# Patient Record
Sex: Male | Born: 1990 | Race: White | Hispanic: No | Marital: Single | Smoking: Current every day smoker
Health system: Southern US, Community
[De-identification: ages and names within clinical notes are randomized; demographics above are authoritative.]

## PROBLEM LIST (undated history)

## (undated) DIAGNOSIS — E7800 Pure hypercholesterolemia, unspecified: Secondary | ICD-10-CM

## (undated) DIAGNOSIS — M1990 Unspecified osteoarthritis, unspecified site: Secondary | ICD-10-CM

## (undated) DIAGNOSIS — F3181 Bipolar II disorder: Secondary | ICD-10-CM

## (undated) DIAGNOSIS — F79 Unspecified intellectual disabilities: Secondary | ICD-10-CM

## (undated) HISTORY — PX: NOSE SURGERY: SHX723

---

## 2010-03-07 ENCOUNTER — Encounter: Payer: Self-pay | Admitting: *Deleted

## 2010-03-13 ENCOUNTER — Emergency Department (HOSPITAL_COMMUNITY): Admission: EM | Admit: 2010-03-13 | Discharge: 2010-03-13 | Payer: Self-pay | Admitting: Emergency Medicine

## 2010-08-19 ENCOUNTER — Emergency Department (HOSPITAL_COMMUNITY): Admission: EM | Admit: 2010-08-19 | Discharge: 2010-08-19 | Payer: Self-pay | Admitting: Emergency Medicine

## 2010-09-01 ENCOUNTER — Emergency Department (HOSPITAL_COMMUNITY): Admission: EM | Admit: 2010-09-01 | Discharge: 2010-09-01 | Payer: Self-pay | Admitting: Emergency Medicine

## 2010-10-25 ENCOUNTER — Emergency Department (HOSPITAL_COMMUNITY): Admission: EM | Admit: 2010-10-25 | Discharge: 2010-10-26 | Payer: Self-pay | Admitting: Emergency Medicine

## 2010-11-13 ENCOUNTER — Inpatient Hospital Stay (HOSPITAL_COMMUNITY)
Admission: RE | Admit: 2010-11-13 | Discharge: 2010-11-17 | Payer: Self-pay | Source: Home / Self Care | Attending: Psychiatry | Admitting: Psychiatry

## 2010-11-13 ENCOUNTER — Emergency Department (HOSPITAL_COMMUNITY)
Admission: EM | Admit: 2010-11-13 | Discharge: 2010-11-13 | Disposition: A | Discharge status: Other Acute Inpatient Hospital | Payer: Self-pay | Source: Home / Self Care | Admitting: Emergency Medicine

## 2011-01-06 NOTE — Miscellaneous (Signed)
Summary: Do Not Reschedule!  Missed NP appt.  Per [facility] policy is not allowed to reschedule.  Dennison Nancy RN  March 07, 2010 11:29 AM

## 2011-02-17 LAB — CBC
HCT: 43.1 % (ref 39.0–52.0)
Hemoglobin: 14.9 g/dL (ref 13.0–17.0)
MCH: 29.2 pg (ref 26.0–34.0)
MCHC: 35.1 g/dL (ref 30.0–36.0)
MCV: 83.1 fL (ref 78.0–100.0)
Platelets: 156 10*3/uL (ref 150–400)
RBC: 5.04 MIL/uL (ref 4.22–5.81)
RBC: 5.1 MIL/uL (ref 4.22–5.81)

## 2011-02-17 LAB — BASIC METABOLIC PANEL
BUN: 9 mg/dL (ref 6–23)
CO2: 26 mEq/L (ref 19–32)
CO2: 26 mEq/L (ref 19–32)
Calcium: 9.5 mg/dL (ref 8.4–10.5)
Calcium: 9.7 mg/dL (ref 8.4–10.5)
Chloride: 102 mEq/L (ref 96–112)
Creatinine, Ser: 0.66 mg/dL (ref 0.4–1.5)
GFR calc Af Amer: >60 mL/min (ref >60)
GFR calc Af Amer: >60 mL/min (ref >60)
GFR calc non Af Amer: >60 mL/min (ref >60)
Glucose, Bld: 90 mg/dL (ref 70–99)
Potassium: 3.8 mEq/L (ref 3.5–5.1)
Sodium: 141 mEq/L (ref 135–145)

## 2011-02-17 LAB — RAPID URINE DRUG SCREEN, HOSP PERFORMED
Amphetamines: NOT DETECTED
Amphetamines: NOT DETECTED
Barbiturates: NOT DETECTED
Benzodiazepines: NOT DETECTED
Cocaine: NOT DETECTED
Opiates: NOT DETECTED
Tetrahydrocannabinol: NOT DETECTED
Tetrahydrocannabinol: NOT DETECTED

## 2011-02-17 LAB — DIFFERENTIAL
Basophils Relative: 1 % (ref 0–1)
Eosinophils Absolute: 0.1 10*3/uL (ref 0.0–0.7)
Eosinophils Relative: 2 % (ref 0–5)
Lymphs Abs: 3 10*3/uL (ref 0.7–4.0)
Lymphs Abs: 3.3 10*3/uL (ref 0.7–4.0)
Monocytes Relative: 8 % (ref 3–12)
Neutro Abs: 4.3 10*3/uL (ref 1.7–7.7)
Neutrophils Relative %: 50 % (ref 43–77)
Neutrophils Relative %: 54 % (ref 43–77)

## 2011-05-01 ENCOUNTER — Inpatient Hospital Stay (INDEPENDENT_AMBULATORY_CARE_PROVIDER_SITE_OTHER)
Admission: RE | Admit: 2011-05-01 | Discharge: 2011-05-01 | Disposition: A | Discharge status: Home / Self Care | Payer: Self-pay | Source: Ambulatory Visit | Attending: Family Medicine | Admitting: Family Medicine

## 2011-05-01 DIAGNOSIS — L989 Disorder of the skin and subcutaneous tissue, unspecified: Secondary | ICD-10-CM

## 2011-05-01 DIAGNOSIS — L259 Unspecified contact dermatitis, unspecified cause: Secondary | ICD-10-CM

## 2016-01-16 ENCOUNTER — Emergency Department (HOSPITAL_COMMUNITY)
Admission: EM | Admit: 2016-01-16 | Discharge: 2016-01-16 | Disposition: A | Discharge status: Home / Self Care | Payer: Medicaid Other | Attending: Emergency Medicine | Admitting: Emergency Medicine

## 2016-01-16 ENCOUNTER — Emergency Department (HOSPITAL_COMMUNITY): Payer: Medicaid Other

## 2016-01-16 ENCOUNTER — Encounter (HOSPITAL_COMMUNITY): Payer: Self-pay

## 2016-01-16 DIAGNOSIS — Z79899 Other long term (current) drug therapy: Secondary | ICD-10-CM | POA: Diagnosis not present

## 2016-01-16 DIAGNOSIS — R202 Paresthesia of skin: Secondary | ICD-10-CM | POA: Diagnosis not present

## 2016-01-16 DIAGNOSIS — Z8659 Personal history of other mental and behavioral disorders: Secondary | ICD-10-CM | POA: Insufficient documentation

## 2016-01-16 DIAGNOSIS — Z8639 Personal history of other endocrine, nutritional and metabolic disease: Secondary | ICD-10-CM | POA: Insufficient documentation

## 2016-01-16 DIAGNOSIS — R0789 Other chest pain: Secondary | ICD-10-CM | POA: Diagnosis not present

## 2016-01-16 DIAGNOSIS — F17210 Nicotine dependence, cigarettes, uncomplicated: Secondary | ICD-10-CM | POA: Insufficient documentation

## 2016-01-16 DIAGNOSIS — R079 Chest pain, unspecified: Secondary | ICD-10-CM | POA: Diagnosis present

## 2016-01-16 HISTORY — DX: Bipolar II disorder: F31.81

## 2016-01-16 HISTORY — DX: Unspecified intellectual disabilities: F79

## 2016-01-16 HISTORY — DX: Pure hypercholesterolemia, unspecified: E78.00

## 2016-01-16 LAB — BASIC METABOLIC PANEL
Anion gap: 11 (ref 5–15)
BUN: 12 mg/dL (ref 6–20)
CALCIUM: 9.2 mg/dL (ref 8.9–10.3)
CO2: 23 mmol/L (ref 22–32)
CREATININE: 0.57 mg/dL — AB (ref 0.61–1.24)
Chloride: 104 mmol/L (ref 101–111)
GFR calc non Af Amer: >60 mL/min (ref >60)
GLUCOSE: 113 mg/dL — AB (ref 65–99)
Potassium: 3.7 mmol/L (ref 3.5–5.1)
Sodium: 138 mmol/L (ref 135–145)

## 2016-01-16 LAB — CBC
HCT: 41.2 % (ref 39.0–52.0)
Hemoglobin: 13.6 g/dL (ref 13.0–17.0)
MCH: 28.3 pg (ref 26.0–34.0)
MCHC: 33 g/dL (ref 30.0–36.0)
MCV: 85.7 fL (ref 78.0–100.0)
PLATELETS: 145 10*3/uL — AB (ref 150–400)
RBC: 4.81 MIL/uL (ref 4.22–5.81)
RDW: 13.3 % (ref 11.5–15.5)
WBC: 10.7 10*3/uL — ABNORMAL HIGH (ref 4.0–10.5)

## 2016-01-16 LAB — I-STAT TROPONIN, ED: TROPONIN I, POC: 0 ng/mL (ref 0.00–0.08)

## 2016-01-16 NOTE — ED Provider Notes (Signed)
CSN: [id number]     Arrival date & time 01/16/16  1515 History   First MD Initiated Contact with Patient 01/16/16 1543     Chief Complaint  Patient presents with  . Chest Pain  . Numbness    HPI   Glenn French is a 25 y.o. male with a PMH of HLD, MR, bipolar disorder who presents to the ED with right sided chest pain, which he states has been present for the past month. He reports his symptoms are intermittent. He denies exacerbating factors. He has tried ibuprofen for symptom relief, which has been minimally effective. He denies fever, chills, cough, congestion, shortness of breath, abdominal pain, N/V, recent travel or immobility, recent surgery, history of DVT/PE, history of malignancy, lower extremity edema. He also notes right arm paresthesia, which he states started today. He denies numbness or weakness.   Past Medical History  Diagnosis Date  . High cholesterol   . Mental retardation   . Bipolar 2 disorder [facility])    Past Surgical History  Procedure Laterality Date  . Nose surgery     History reviewed. No pertinent family history. Social History  Substance Use Topics  . Smoking status: Current Every Day Smoker -- 0.50 packs/day    Types: Cigarettes  . Smokeless tobacco: None  . Alcohol Use: No      Review of Systems  Constitutional: Negative for fever and chills.  HENT: Negative for congestion.   Respiratory: Negative for cough and shortness of breath.   Cardiovascular: Positive for chest pain. Negative for palpitations.  Gastrointestinal: Negative for nausea, vomiting and abdominal pain.  Neurological: Negative for weakness and numbness.  All other systems reviewed and are negative.     Allergies  Review of patient's allergies indicates no known allergies.  Home Medications   Prior to Admission medications   Medication Sig Start Date End Date Taking? Authorizing Provider  divalproex (DEPAKOTE ER) 250 MG 24 hr tablet Take 500 mg by mouth at bedtime.   Yes  Historical Provider, MD  ibuprofen (ADVIL,MOTRIN) 200 MG tablet Take 600 mg by mouth daily as needed for headache or moderate pain.   Yes Historical Provider, MD  Paliperidone Palmitate (INVEGA SUSTENNA) 117 MG/0.75ML SUSP Inject 117 mg into the muscle every 30 (thirty) days.   Yes Historical Provider, MD  polyethylene glycol (MIRALAX / GLYCOLAX) packet Take 17 g by mouth daily.   Yes Historical Provider, MD  psyllium (METAMUCIL) 58.6 % powder Take 1 packet by mouth 3 (three) times daily.   Yes Historical Provider, MD    BP 148/96 mmHg  Pulse 74  Temp(Src) 98 F (36.7 C) (Oral)  Resp 17  SpO2 96% Physical Exam  Constitutional: He is oriented to person, place, and time. He appears well-developed and well-nourished. No distress.  HENT:  Head: Normocephalic and atraumatic.  Right Ear: External ear normal.  Left Ear: External ear normal.  Nose: Nose normal.  Mouth/Throat: Uvula is midline, oropharynx is clear and moist and mucous membranes are normal.  Eyes: Conjunctivae, EOM and lids are normal. Pupils are equal, round, and reactive to light. Right eye exhibits no discharge. Left eye exhibits no discharge. No scleral icterus.  Neck: Normal range of motion. Neck supple.  Cardiovascular: Normal rate, regular rhythm, normal heart sounds, intact distal pulses and normal pulses.   Pulmonary/Chest: Effort normal and breath sounds normal. No respiratory distress. He has no wheezes. He has no rales. He exhibits tenderness.  Right anterior chest wall TTP. Patient states this  reproduces his chest pain.  Abdominal: Soft. Normal appearance and bowel sounds are normal. He exhibits no distension and no mass. There is no tenderness. There is no rigidity, no rebound and no guarding.  Musculoskeletal: Normal range of motion. He exhibits no edema or tenderness.  Neurological: He is alert and oriented to person, place, and time. He has normal strength. No sensory deficit.  Skin: Skin is warm, dry and intact.  No rash noted. He is not diaphoretic. No erythema. No pallor.  Psychiatric: He has a normal mood and affect. His speech is normal and behavior is normal.  Nursing note and vitals reviewed.   ED Course  Procedures (including critical care time)  Labs Review Labs Reviewed  BASIC METABOLIC PANEL - Abnormal; Notable for the following:    Glucose, Bld 113 (*)    Creatinine, Ser 0.57 (*)    All other components within normal limits  CBC - Abnormal; Notable for the following:    WBC 10.7 (*)    Platelets 145 (*)    All other components within normal limits  I-STAT TROPOININ, ED    Imaging Review Dg Chest 2 View  01/16/2016  CLINICAL DATA:  Right-sided chest pain for 1 month EXAM: CHEST  2 VIEW COMPARISON:  None. FINDINGS: Lungs are clear. Heart size and pulmonary vascularity are normal. No adenopathy. No pneumothorax. No bone lesions. IMPRESSION: No edema or consolidation. Electronically Signed   By: Bretta Bang III M.D.   On: 01/16/2016 16:21     I have personally reviewed and evaluated these images and lab results as part of my medical decision-making.   EKG Interpretation   Date/Time:  Thursday January 16 2016 16:28:10 EST Ventricular Rate:  79 PR Interval:  158 QRS Duration: 86 QT Interval:  389 QTC Calculation: 446 R Axis:   80 Text Interpretation:  Sinus rhythm No old tracing to compare Confirmed by  KNAPP  MD-J, JON ([id number]) on 01/16/2016 4:33:35 PM      MDM   Final diagnoses:  Chest pain, unspecified chest pain type    25 year old male presents with chest pain. Denies cough, shortness of breath. Patient is afebrile. Vital signs stable. Heart RRR. Lungs clear to auscultation bilaterally. Right anterior chest wall TTP, patient states this reproduces his pain. EKG sinus rhythm, heart rate 79. Troponin negative. Chest x-ray no edema or consolidation. CBC remarkable for leukocytosis of 10.7, no anemia. BMP unremarkable. Doubt ACS given duration of time since symptom  onset and unremarkable workup in the ED. Patient is PERC negative, low suspicion for PE. Patient is non-toxic and well-appearing, feel he is stable for discharge at this time. Symptoms likely muscular, as pain is reproducible on exam of his chest wall. Return precautions discussed. Patient to follow-up with PCP for further evaluation and management. Patient verbalizes his understanding and is in agreement with plan.  BP 148/96 mmHg  Pulse 74  Temp(Src) 98 F (36.7 C) (Oral)  Resp 17  SpO2 96%     Mady Gemma, PA-C 01/17/16 0107  Linwood Dibbles, MD 01/18/16 1226

## 2016-01-16 NOTE — ED Notes (Signed)
Pt c/o intermittent central chest pressure and R axilla pain x "over a month" and R arm numbness starting around noon.  Pain score 5/10.  Denies injury.  Grips/pushes/pulls equal.  Hx of smoking and high cholesterol.

## 2016-01-16 NOTE — Discharge Instructions (Signed)
1. Medications: ibuprofen for pain, usual home medications 2. Treatment: rest, drink plenty of fluids 3. Follow Up: please followup with your primary doctor for discussion of your diagnoses and further evaluation after today's visit; if you do not have a primary care doctor use the resource guide provided to find one; please return to the ER for increased pain, shortness of breath, new or worsening symptoms   Chest Wall Pain Chest wall pain is pain in or around the bones and muscles of your chest. Sometimes, an injury causes this pain. Sometimes, the cause may not be known. This pain may take several weeks or longer to get better. HOME CARE Pay attention to any changes in your symptoms. Take these actions to help with your pain:  Rest as told by your doctor.  Avoid activities that cause pain. Try not to use your chest, belly (abdominal), or side muscles to lift heavy things.  If directed, apply ice to the painful area:  Put ice in a plastic bag.  Place a towel between your skin and the bag.  Leave the ice on for 20 minutes, 2-3 times per day.  Take over-the-counter and prescription medicines only as told by your doctor.  Do not use tobacco products, including cigarettes, chewing tobacco, and e-cigarettes. If you need help quitting, ask your doctor.  Keep all follow-up visits as told by your doctor. This is important. GET HELP IF:  You have a fever.  Your chest pain gets worse.  You have new symptoms. GET HELP RIGHT AWAY IF:  You feel sick to your stomach (nauseous) or you throw up (vomit).  You feel sweaty or light-headed.  You have a cough with phlegm (sputum) or you cough up blood.  You are short of breath.   This information is not intended to replace advice given to you by your health care provider. Make sure you discuss any questions you have with your health care provider.   Document Released: 05/11/2008 Document Revised: 08/14/2015 Document Reviewed:  02/18/2015 Elsevier Interactive Patient Education 2016 [employer].   Emergency Department Resource Guide 1) Find a Doctor and Pay Out of Pocket Although you won't have to find out who is covered by your insurance plan, it is a good idea to ask around and get recommendations. You will then need to call the office and see if the doctor you have chosen will accept you as a new patient and what types of options they offer for patients who are self-pay. Some doctors offer discounts or will set up payment plans for their patients who do not have insurance, but you will need to ask so you aren't surprised when you get to your appointment.  2) Contact Your Local Health Department Not all health departments have doctors that can see patients for sick visits, but many do, so it is worth a call to see if yours does. If you don't know where your local health department is, you can check in your phone book. The CDC also has a tool to help you locate your state's health department, and many state websites also have listings of all of their local health departments.  3) Find a Walk-in Clinic If your illness is not likely to be very severe or complicated, you may want to try a walk in clinic. These are popping up all over the country in pharmacies, drugstores, and shopping centers. They're usually staffed by nurse practitioners or physician assistants that have been trained to treat common illnesses and complaints.  They're usually fairly quick and inexpensive. However, if you have serious medical issues or chronic medical problems, these are probably not your best option.  No Primary Care Doctor: - Call Health Connect at  [phone] - they can help you locate a primary care doctor that  accepts your insurance, provides certain services, etc. - Physician Referral Service- [phone]  Chronic Pain Problems: Organization         Address  Phone   Notes  [facility]  [phone]) [phone] Patients  need to be referred by their primary care doctor.   Medication Assistance: Organization         Address  Phone   Notes  [facility]ication [facility] [address] [place], [place] [address] [phone]) [phone] --Must be a resident of [facility] [place] -- Must have NO insurance coverage whatsoever (no Medicaid/ Medicare, etc.) -- The pt. MUST have a primary care doctor that directs their care regularly and follows them in the community   MedAssist  [phone]) [phone]   [employer]  [phone]    Agencies that provide inexpensive medical care: Organization         Address  Phone   Notes  [facility]  [phone]) [phone]   [facility]    [phone]) [phone]   [facility] [address] [place], [place] [address] [phone]) [phone]   [facility] [address], [place] [phone]   Planned Parenthood    [phone]) [phone]   [facility]    [phone](336) [phone]   [facility]  [address], [place] Phone:  [phone], Fax:  [phone]) [phone] Hours of Operation:  9 am - 6 pm, M-F.  Also accepts Medicaid/Medicare and self-pay.  [facility]  [address], [place] Phone: [phone]) [phone], Fax: [phone]) [phone]. Hours of Operation:  8:30 am - 5:30 pm, M-F.  Also accepts Medicaid and self-pay.  [facility] [place] [address], [place] Phone: [phone]) [phone]   [facility] [address], [place], [place] [phone], Ext. [phone] Mondays & Thursdays: 7-9 AM.  First 15 patients are seen on a first come, first serve basis.    Medicaid-accepting [facility] Providers:  Organization         Address  Phone   Notes  [facility] [address], [place] [phone]) [phone] Also accepts self-pay patients.  [facility] [address], [place]  [phone](336) [phone]    [facility] [address], [place] [phone]) [phone]   [facility] [address] [place] [address], [place] [phone]) [phone]   Renaye RakersVeita Bland [address], [place]   [phone]) [phone] Only accepts [place] Access [place] patients after they have their name applied to their card.   Self-Pay (no insurance) in [facility]:  Organization         Address  Phone   Notes  Sickle Cell Patients, [facility] [address], [place] [phone]) [phone]   [facility] [address], [place] [phone]   [facility] [place]  [address], [place] [phone]) [phone]   [facility]/Dr. Osei-Bonsu  [address] [place] [address], [place] or [phone], [place] [phone] Phone number for both [facility] and [place] locations is the same.  [facility] [address], [place] [phone]   [facility] [address], [place] or [address] [phone]   [facility] [address] [place] [phone], phone; [phone], fax Sees patients 1st and 3rd Saturday of every month.  Must not qualify for public or private insurance (i.e. Medicaid, Medicare, [place] Health Choice, Veterans' Benefits)  Household income should be no more than 200% of the poverty level The clinic cannot treat you if you are pregnant or think you are pregnant  Sexually transmitted diseases are not treated at the clinic.    Dental Care: Organization         Address  Phone  Notes  [facility] [address] [place], [place] [phone] Accepts children up to age 79 who are enrolled in [place] or [place] Health Choice; pregnant women with a Medicaid card; and children who have applied for Medicaid or [place] Health Choice, but were declined,  whose parents can pay a reduced fee at time of service.  [facility] of [facility]  [address], [place] [phone] Accepts children up to age 7 who are enrolled in [place] or [place] Health Choice; pregnant women with a Medicaid card; and children who have applied for Medicaid or [place] Health Choice, but were declined, whose parents can pay a reduced fee at time of service.  [facility]  [address] [place], [place] [phone] Patients are seen by appointment only. Walk-ins are not accepted. [facility] will see patients 17 years of age and older. Monday - Tuesday (8am-5pm) Most Wednesdays (8:30-5pm) $30 per visit, cash only  [facility] Access PRO[facility]  [address], [facility] [phone] Patients are seen by appointment only. Walk-ins are not accepted. [facility] will see patients 37 years of age and older. One Wednesday Evening (Monthly: Volunteer Based).  $30 per visit, cash only  [employer]  [phone] for adults; Children under age 86, call Graduate Pediatric Dentistry at [phone]. Children aged 11-14, please call [phone] to request a pediatric application.  Dental services are provided in all areas of dental care including fillings, crowns and bridges, complete and partial dentures, implants, gum treatment, root canals, and extractions. Preventive care is also provided. Treatment is provided to both adults and children. Patients are selected via a lottery and there is often a waiting list.   [facility] [address] [place]  [phone] [email or web address]   [facility] [address] [place], [place] [phone], Ext. [phone] Second and Fourth Thursday of each month, opens at 6:30 AM; Clinic ends at 9 AM.  Patients are seen on a first-come first-served basis, and a limited number are seen during each clinic.   [facility]  [address] [place], [place] [phone]   Eligibility Requirements You must have lived in [place], [place], or [place] counties for at least the last three months.   You cannot be eligible for state or federal sponsored [place], including [employer], [place], or [employer].   You generally cannot be eligible for healthcare insurance through your employer.    How to apply: Eligibility screenings are held every Tuesday and Wednesday afternoon from  1:00 pm until 4:00 pm. You do not need an appointment for the interview!  [facility] [address] [place], [place] [phone]   [facility] Health Department  [phone]   [facility] Health Department  [phone]   [facility] Health Department  [phone]    Behavioral Health Resources in the Community: Intensive Outpatient Programs Organization         Address  Phone  Notes  [facility] [address] [place], [place] [phone]   [facility] Outpatient [address] [place], [place] [phone]   [facility] [address] [place], [place]  [phone]   [facility] [address]  [place], [place] [phone] or [phone]   Substance Abuse Resources Organization         Address  Phone  Notes  Alcohol [facility] Drug Services  [phone]   [facility]  [phone]   [place]  [phone]   Floydene Flock  [phone]   Residential & Outpatient Substance Abuse Program  [phone]   Psychological Services Organization         Address  Phone  Notes  [facility] Health  [phone]   [facility]  [phone]   [facility] [address], [place] [phone] or [phone]    Mobile Crisis Teams Organization         Address  Phone  Notes  [facility], Mobile Crisis Care Unit   [phone]   [facility]  [address] [place], [place] [phone]   Doristine Locks [address] [place] [phone]    Self-Help/Support Groups Organization         Address  Phone             Notes  [facility] [place] - variety of support groups  [phone]- I7437963 Call for more information  Narcotics Anonymous (NA), [facility] [address], [employer] [place]  2 meetings at this location   Statistician         Address  Phone  Notes  [facility] [address],    [place]  [phone]   [facility]  [address] [place] [address], [place], [place] [phone]   [facility] [address] [place], [place] [phone] Admissions: 8am-3pm M-F  [facility] [address],    [place], [place] [phone]   [facility] [address] [place], [place], [place] [phone]   [facility] [address]  [place], [place] [phone]   [facility] - Intensive Outpatient [address], [place], [place] [phone]   [facility] ([facility] [address]  [place], [place] [phone] or [phone]   [facility] ([facility]) [address] [place], [place] [phone] Accepts Medicaid  [facility] [place] [address],  [place] [phone] Substance Abuse/Addiction Treatment   [facility] Organization         Address  Phone  Notes  [facility]  [phone]   Angie Fava, PhD [address] [place], [place]   [phone] or [phone]   [facility] Behavioral   [address] [place], [place] [phone]   [facility] [address] [place], [place], [place] [phone] Insurance/Medicaid/sponsorship through [employer] and Families [address]                                     [place], [place] [phone] Therapy/tele-psych/case  [facility] [address]   [place], [place] [phone]    Dr. Lolly Mustache  [phone]   [facility]  [employer] [facility] Dept. 1) [address], [place] 2) [address] [place] 3)  [address], [place] [phone]  [phone]   [facility] Child Abuse Hotline 403-855-6584 or [phone] (After Hours)

## 2017-09-11 DIAGNOSIS — R21 Rash and other nonspecific skin eruption: Secondary | ICD-10-CM | POA: Diagnosis present

## 2017-09-11 DIAGNOSIS — F79 Unspecified intellectual disabilities: Secondary | ICD-10-CM | POA: Diagnosis not present

## 2017-09-11 DIAGNOSIS — F17210 Nicotine dependence, cigarettes, uncomplicated: Secondary | ICD-10-CM | POA: Insufficient documentation

## 2017-09-11 DIAGNOSIS — L245 Irritant contact dermatitis due to other chemical products: Secondary | ICD-10-CM | POA: Insufficient documentation

## 2017-09-11 DIAGNOSIS — Z79899 Other long term (current) drug therapy: Secondary | ICD-10-CM | POA: Insufficient documentation

## 2017-09-12 ENCOUNTER — Encounter (HOSPITAL_COMMUNITY): Payer: Self-pay | Admitting: Emergency Medicine

## 2017-09-12 ENCOUNTER — Emergency Department (HOSPITAL_COMMUNITY)
Admission: EM | Admit: 2017-09-12 | Discharge: 2017-09-12 | Disposition: A | Discharge status: Home / Self Care | Payer: Medicaid Other | Attending: Emergency Medicine | Admitting: Emergency Medicine

## 2017-09-12 DIAGNOSIS — L245 Irritant contact dermatitis due to other chemical products: Secondary | ICD-10-CM

## 2017-09-12 MED ORDER — BACITRACIN ZINC 500 UNIT/GM EX OINT
1.0000 | TOPICAL_OINTMENT | Freq: Two times a day (BID) | CUTANEOUS | 0 refills | Status: DC
Start: 2017-09-12 — End: 2018-03-06

## 2017-09-12 NOTE — ED Provider Notes (Signed)
[facility]-EMERGENCY DEPT Provider Note   CSN: [id number] Arrival date & time: 09/11/17  2338     History   Chief Complaint Chief Complaint  Patient presents with  . Rash    HPI Esdras Delair is a 26 y.o. male.  Patient presents to the ED with a chief complaint of skin rash.  He states that he has had a rash on his bottom for the past several days.  He describes the rash as burning.  He states that he has been bathing in baking soda, and noticed the rash after doing this.  He denies any other contacts with new allergens.  Denies any new medications.  Denies any treatment of his symptoms.   The history is provided by the patient. No language interpreter was used.    Past Medical History:  Diagnosis Date  . Bipolar 2 disorder (HCC)   . High cholesterol   . Mental retardation     There are no active problems to display for this patient.   Past Surgical History:  Procedure Laterality Date  . NOSE SURGERY         Home Medications    Prior to Admission medications   Medication Sig Start Date End Date Taking? Authorizing Provider  divalproex (DEPAKOTE ER) 250 MG 24 hr tablet Take 500 mg by mouth at bedtime.    [provider]  ibuprofen (ADVIL,MOTRIN) 200 MG tablet Take 600 mg by mouth daily as needed for headache or moderate pain.    [provider]  Paliperidone Palmitate (INVEGA SUSTENNA) 117 MG/0.75ML SUSP Inject 117 mg into the muscle every 30 (thirty) days.    [provider]  polyethylene glycol (MIRALAX / GLYCOLAX) packet Take 17 g by mouth daily.    [provider]  psyllium (METAMUCIL) 58.6 % powder Take 1 packet by mouth 3 (three) times daily.    [provider]    Family History History reviewed. No pertinent family history.  Social History Social History  Substance Use Topics  . Smoking status: Current Every Day Smoker    Packs/day: 0.50    Types: Cigarettes  . Smokeless tobacco: Not on file  . Alcohol use No      Allergies   Bee venom   Review of Systems Review of Systems  All other systems reviewed and are negative.    Physical Exam Updated Vital Signs BP 132/81 (BP Location: Left Arm)   Pulse 96   Temp 98.2 F (36.8 C) (Oral)   Resp 17   Ht  (1.676 m)   Wt 93.9 kg (207 lb)   SpO2 96%   BMI 33.41 kg/m   Physical Exam  Constitutional: He is oriented to person, place, and time. He appears well-developed and well-nourished.  HENT:  Head: Normocephalic and atraumatic.  Eyes: Pupils are equal, round, and reactive to light. Conjunctivae and EOM are normal. Right eye exhibits no discharge. Left eye exhibits no discharge. No scleral icterus.  Neck: Normal range of motion. Neck supple. No JVD present.  Cardiovascular: Normal rate, regular rhythm and normal heart sounds.  Exam reveals no gallop and no friction rub.   No murmur heard. Pulmonary/Chest: Effort normal and breath sounds normal. No respiratory distress. He has no wheezes. He has no rales. He exhibits no tenderness.  Abdominal: Soft. He exhibits no distension and no mass. There is no tenderness. There is no rebound and no guarding.  Musculoskeletal: Normal range of motion. He exhibits no edema or tenderness.  Neurological:  He is alert and oriented to person, place, and time.  Skin: Skin is warm and dry.  Rash as pictured, seems consistent with contact/chemical burn/dermatitis  Psychiatric: He has a normal mood and affect. His behavior is normal. Judgment and thought content normal.  Nursing note and vitals reviewed.      ED Treatments / Results  Labs (all labs ordered are listed, but only abnormal results are displayed) Labs Reviewed - No data to display  EKG  EKG Interpretation None       Radiology No results found.  Procedures Procedures (including critical care time)  Medications Ordered in ED Medications - No data to display   Initial Impression / Assessment and Plan / ED Course  I have  reviewed the triage vital signs and the nursing notes.  Pertinent labs & imaging results that were available during my care of the patient were reviewed by me and considered in my medical decision making (see chart for details).       Final Clinical Impressions(s) / ED Diagnoses   Final diagnoses:  Irritant contact dermatitis due to other chemical products    New Prescriptions New Prescriptions   BACITRACIN OINTMENT    Apply 1 application topically 2 (two) times daily.     Roxy Horseman, PA-C 09/12/17 0400    Geoffery Lyons, MD 09/12/17 [phone]

## 2017-09-12 NOTE — ED Triage Notes (Signed)
Pt reports having rash to buttocks and tingling to inner thigh for the last week. Unknown if an insect may have bitten to cause rash.

## 2018-01-03 ENCOUNTER — Emergency Department (HOSPITAL_COMMUNITY)
Admission: EM | Admit: 2018-01-03 | Discharge: 2018-01-04 | Disposition: A | Discharge status: Home / Self Care | Payer: Medicaid Other | Attending: Emergency Medicine | Admitting: Emergency Medicine

## 2018-01-03 ENCOUNTER — Emergency Department (HOSPITAL_COMMUNITY): Payer: Medicaid Other

## 2018-01-03 ENCOUNTER — Encounter (HOSPITAL_COMMUNITY): Payer: Self-pay | Admitting: Emergency Medicine

## 2018-01-03 DIAGNOSIS — R51 Headache: Secondary | ICD-10-CM | POA: Diagnosis not present

## 2018-01-03 DIAGNOSIS — F3170 Bipolar disorder, currently in remission, most recent episode unspecified: Secondary | ICD-10-CM | POA: Insufficient documentation

## 2018-01-03 DIAGNOSIS — Z79899 Other long term (current) drug therapy: Secondary | ICD-10-CM | POA: Diagnosis not present

## 2018-01-03 DIAGNOSIS — R413 Other amnesia: Secondary | ICD-10-CM | POA: Insufficient documentation

## 2018-01-03 DIAGNOSIS — F79 Unspecified intellectual disabilities: Secondary | ICD-10-CM | POA: Insufficient documentation

## 2018-01-03 DIAGNOSIS — F17210 Nicotine dependence, cigarettes, uncomplicated: Secondary | ICD-10-CM | POA: Insufficient documentation

## 2018-01-03 HISTORY — DX: Unspecified osteoarthritis, unspecified site: M19.90

## 2018-01-03 LAB — RAPID URINE DRUG SCREEN, HOSP PERFORMED
AMPHETAMINES: NOT DETECTED
BENZODIAZEPINES: NOT DETECTED
Barbiturates: NOT DETECTED
Cocaine: NOT DETECTED
OPIATES: NOT DETECTED
Tetrahydrocannabinol: NOT DETECTED

## 2018-01-03 LAB — COMPREHENSIVE METABOLIC PANEL
ALBUMIN: 4.3 g/dL (ref 3.5–5.0)
ALK PHOS: 104 U/L (ref 38–126)
ALT: 53 U/L (ref 17–63)
ANION GAP: 7 (ref 5–15)
AST: 31 U/L (ref 15–41)
BILIRUBIN TOTAL: 0.2 mg/dL — AB (ref 0.3–1.2)
BUN: 11 mg/dL (ref 6–20)
CALCIUM: 9.3 mg/dL (ref 8.9–10.3)
CO2: 26 mmol/L (ref 22–32)
Chloride: 105 mmol/L (ref 101–111)
Creatinine, Ser: 0.55 mg/dL — ABNORMAL LOW (ref 0.61–1.24)
GFR calc Af Amer: >60 mL/min (ref >60)
GLUCOSE: 101 mg/dL — AB (ref 65–99)
POTASSIUM: 3.9 mmol/L (ref 3.5–5.1)
Sodium: 138 mmol/L (ref 135–145)
TOTAL PROTEIN: 6.6 g/dL (ref 6.5–8.1)

## 2018-01-03 LAB — VALPROIC ACID LEVEL

## 2018-01-03 LAB — CBG MONITORING, ED: Glucose-Capillary: 102 mg/dL — ABNORMAL HIGH (ref 65–99)

## 2018-01-03 LAB — CBC WITH DIFFERENTIAL/PLATELET
BASOS PCT: 0 %
Basophils Absolute: 0 10*3/uL (ref 0.0–0.1)
EOS PCT: 6 %
Eosinophils Absolute: 0.6 10*3/uL (ref 0.0–0.7)
HEMATOCRIT: 45.2 % (ref 39.0–52.0)
HEMOGLOBIN: 15.2 g/dL (ref 13.0–17.0)
LYMPHS ABS: 4.1 10*3/uL — AB (ref 0.7–4.0)
LYMPHS PCT: 41 %
MCH: 29.1 pg (ref 26.0–34.0)
MCHC: 33.6 g/dL (ref 30.0–36.0)
MCV: 86.4 fL (ref 78.0–100.0)
MONOS PCT: 8 %
Monocytes Absolute: 0.8 10*3/uL (ref 0.1–1.0)
NEUTROS ABS: 4.5 10*3/uL (ref 1.7–7.7)
Neutrophils Relative %: 45 %
Platelets: 129 10*3/uL — ABNORMAL LOW (ref 150–400)
RBC: 5.23 MIL/uL (ref 4.22–5.81)
RDW: 13.2 % (ref 11.5–15.5)
WBC: 10 10*3/uL (ref 4.0–10.5)

## 2018-01-03 LAB — ETHANOL: Alcohol, Ethyl (B): <10 mg/dL (ref <10)

## 2018-01-03 LAB — URINALYSIS, ROUTINE W REFLEX MICROSCOPIC
Bilirubin Urine: NEGATIVE
Glucose, UA: NEGATIVE mg/dL
HGB URINE DIPSTICK: NEGATIVE
Ketones, ur: NEGATIVE mg/dL
Leukocytes, UA: NEGATIVE
NITRITE: NEGATIVE
PH: 5 (ref 5.0–8.0)
Protein, ur: NEGATIVE mg/dL
SPECIFIC GRAVITY, URINE: 1.017 (ref 1.005–1.030)

## 2018-01-03 LAB — SALICYLATE LEVEL

## 2018-01-03 LAB — ACETAMINOPHEN LEVEL

## 2018-01-03 NOTE — ED Triage Notes (Addendum)
Pt is here for memory loss and migraine. Per EMS, pt has hx of cognitive impairment and "has the mind of a 27 year old". Pt has no family here. Pt is not febrile nor tachycardic. Pt denies fall or head injury. Pt has right temporal headache. Pt was unable to report his name to EMS when they arrived on the scene. Pt states his last migraine was 2 weeks ago. Pt has normal neuro exam. Per EMS pt's apartment was in disrepair and was uninhabitable. Per pt he has no family who helps him care for himself, however someone called EMS stating "they were getting bizarre text messages from pt" [employer] were then unable to get in touch with person who called 911. Upon assessment of pt's phone he was texting someone named [place]. Pt states he is unsure who that is. Pt remembers he has a dr. Appt tomorrow and is now able to report his name.

## 2018-01-03 NOTE — ED Notes (Signed)
Bed: [facility] Expected date:  Expected time:  Means of arrival:  Comments: 

## 2018-01-03 NOTE — ED Provider Notes (Signed)
[facility]-EMERGENCY DEPT Provider Note   CSN: [id number] Arrival date & time: 01/03/18  1927     History   Chief Complaint Chief Complaint  Patient presents with  . Migraine  . Memory Loss   Level V caveat due to mental retardation. History obtained from patient, chart review and triage note.  HPI Glenn French is a 27 y.o. male with documented history of hyperlipidemia, arthritis, mental retardation, bipolar disorder here for evaluation of right-sided headache that began approximately 2 hours PTA. Headache is described as sharp, intermittent, waxing and waning. Currently not having headache. States since the headache started his memory has gotten bad. Patient is unable to tell me his name, date of birth, where she is, where he lives with few, medical history or medications that he takes but able to tell me about his headache and memory changes. States right before the headache started he heard a pop in his head. He states he has history of migraines and sometimes his memory goes bad when he has a headache. He is unsure about recent falls. States he has not taken any of his medications lately. Denies fevers, vision changes, nausea, vomiting, chest pain, abdominal pain, urinary symptoms, numbness distally. Denies illicit drug use. Denies suicidal ideation, homicidal ideation, hallucinations. Per triage note, patient brought in for right temporal headache. When EMS arrived to the scene patient's apartment was in disrepair and uninhabitable. Reportedly someone called EMS bc patient was sending bizarre text messages.   HPI  Past Medical History:  Diagnosis Date  . Arthritis   . Bipolar 2 disorder (HCC)   . High cholesterol   . Mental retardation     There are no active problems to display for this patient.   Past Surgical History:  Procedure Laterality Date  . NOSE SURGERY         Home Medications    Prior to Admission medications   Medication Sig Start Date  End Date Taking? Authorizing Provider  bacitracin ointment Apply 1 application topically 2 (two) times daily. 09/12/17   Roxy Horseman, PA-C  divalproex (DEPAKOTE ER) 250 MG 24 hr tablet Take 500 mg by mouth at bedtime.    [provider]  ibuprofen (ADVIL,MOTRIN) 200 MG tablet Take 600 mg by mouth daily as needed for headache or moderate pain.    [provider]  Paliperidone Palmitate (INVEGA SUSTENNA) 117 MG/0.75ML SUSP Inject 117 mg into the muscle every 30 (thirty) days.    [provider]  polyethylene glycol (MIRALAX / GLYCOLAX) packet Take 17 g by mouth daily.    [provider]  psyllium (METAMUCIL) 58.6 % powder Take 1 packet by mouth 3 (three) times daily.    [provider]    Family History No family history on file.  Social History Social History   Tobacco Use  . Smoking status: Current Every Day Smoker    Packs/day: 0.50    Types: Cigarettes  Substance Use Topics  . Alcohol use: No  . Drug use: No     Allergies   Bee venom   Review of Systems Review of Systems  Unable to perform ROS: Other (mental retardation )  Neurological: Positive for headaches.       Memory loss     Physical Exam Updated Vital Signs BP 117/66 (BP Location: Left Arm)   Pulse 70   Temp 98.4 F (36.9 C) (Oral)   Resp 18   SpO2 98%   Physical Exam  Constitutional: He  appears well-developed and well-nourished. No distress.  Non toxic. Asleep but easily arousable to verbal stimuli. No oriented to self, place, time. Able to tell me event leading up to ED visit.   HENT:  Head: Normocephalic and atraumatic.  Nose: Nose normal.  Mouth/Throat: No oropharyngeal exudate.  Moist mucous membranes   Eyes: Conjunctivae and EOM are normal. Pupils are equal, round, and reactive to light.  Neck: Normal range of motion.  Cardiovascular: Normal rate, regular rhythm, normal heart sounds and intact distal pulses.  No murmur heard. 2+ DP and radial  pulses bilaterally. No LE edema.   Pulmonary/Chest: Effort normal and breath sounds normal.  Abdominal: Soft. Bowel sounds are normal. There is no tenderness.  No G/R/R. No suprapubic or CVA tenderness.   Musculoskeletal: Normal range of motion. He exhibits no deformity.  Neurological: He is alert.  Speech is fluent without aphasia. Strength 5/5 with hand grip and ankle F/E.   Sensation to light touch intact in hands and feet. Normal gait and heel to toe. No pronator drift.  Normal finger-to-nose.  CN I and VIII not tested. CN II-XII intact bilaterally.   Skin: Skin is warm and dry. Capillary refill takes less than 2 seconds.  Psychiatric: His speech is normal. His affect is blunt. He is withdrawn. Cognition and memory are impaired. He expresses inappropriate judgment. He exhibits abnormal recent memory and abnormal remote memory.  Unable to tell me name, DOB, date, place, pmh or medicines, who he lives with but able to tell me he is here for a headache and memory problems. Odd affect. Denies SI, HI, hallucinations.   Nursing note and vitals reviewed.    ED Treatments / Results  Labs (all labs ordered are listed, but only abnormal results are displayed) Labs Reviewed  CBC WITH DIFFERENTIAL/PLATELET - Abnormal; Notable for the following components:      Result Value   Platelets 129 (*)    Lymphs Abs 4.1 (*)    All other components within normal limits  COMPREHENSIVE METABOLIC PANEL - Abnormal; Notable for the following components:   Glucose, Bld 101 (*)    Creatinine, Ser 0.55 (*)    Total Bilirubin 0.2 (*)    All other components within normal limits  ACETAMINOPHEN LEVEL - Abnormal; Notable for the following components:   Acetaminophen (Tylenol), Serum <10 (*)    All other components within normal limits  VALPROIC ACID LEVEL - Abnormal; Notable for the following components:   Valproic Acid Lvl <10 (*)    All other components within normal limits  CBG MONITORING, ED - Abnormal;  Notable for the following components:   Glucose-Capillary 102 (*)    All other components within normal limits  RAPID URINE DRUG SCREEN, HOSP PERFORMED  URINALYSIS, ROUTINE W REFLEX MICROSCOPIC  SALICYLATE LEVEL  ETHANOL  CBG MONITORING, ED    EKG  EKG Interpretation None       Radiology Ct Head Wo Contrast  Result Date: 01/03/2018 CLINICAL DATA:  Memory loss and migraine. EXAM: CT HEAD WITHOUT CONTRAST TECHNIQUE: Contiguous axial images were obtained from the base of the skull through the vertex without intravenous contrast. COMPARISON:  None. FINDINGS: Brain: Ventricles are within normal limits in size and configuration. All areas of the brain demonstrate normal gray-white matter attenuation. No mass, hemorrhage, edema or other evidence of acute parenchymal abnormality. No extra-axial hemorrhage. Vascular: No hyperdense vessel or unexpected calcification. Skull: Normal. Negative for fracture or focal lesion. Sinuses/Orbits: No acute finding. Other: None. IMPRESSION: Normal head  CT. Electronically Signed   By: Bary RichardStan  Maynard M.D.   On: 01/03/2018 22:31    Procedures Procedures (including critical care time)  Medications Ordered in ED Medications - No data to display   Initial Impression / Assessment and Plan / ED Course  I have reviewed the triage vital signs and the nursing notes.  Pertinent labs & imaging results that were available during my care of the patient were reviewed by me and considered in my medical decision making (see chart for details).  Clinical Course as of Jan 04 55  Mon Jan 03, 2018  2144 Unable to contact Emeline Generalammy Plaskett [phone]  [CG]  2319 Unable to contact Emeline Generalammy Cerone x 2  [CG]    Clinical Course User Index [CG] Liberty HandyGibbons, Artavious Trebilcock J, PA-C   ED workup so far reassuring. I was unable to contact any family/friends regarding patient's baseline. Given history of mental retardation, bipolar and presenting symptoms today of memory changes I'm concerned  patient may not be competent or safe to be discharged home. Per EMS, patient lives alone in apartment in disrepair and uninhabitable. Chart review reveals patient has attempted to contact PCP/neurology to obtain competency evaluation recently. On 1/14 patient went to PCP office with a caregiver, based on my chart review, unable to obtain this caregiver's contact information. I attempted to contact Emeline Generalammy Lahm as noted above but unable to speak to her. Shared pt with supervising physician who recommends TTS consult. Pt will need competency evaluation and psych clearance before discharge home. Will hand off pt to oncoming EDP who will f/u on TTS recommendations.   2345: Re-evaluated pt. States his headache is gone and his memory "feels better". He is now able to tell me his name, DOB, where he lives. Awaiting TTS consult.  1255: Spoke to [employer] (TTS) who recommends psychiatry evaluation tomorrow AM for competency evaluation. Will hold pt over night for psych eval, CM/SW consult in AM. Discussed plan with pt who is agreeable.  Final Clinical Impressions(s) / ED Diagnoses   Final diagnoses:  None    ED Discharge Orders    None       Jerrell MylarGibbons, Taelyn Nemes J, PA-C 01/04/18 0056    Benjiman CorePickering, Nathan, MD 01/04/18 2329

## 2018-01-04 DIAGNOSIS — F3170 Bipolar disorder, currently in remission, most recent episode unspecified: Secondary | ICD-10-CM | POA: Diagnosis present

## 2018-01-04 NOTE — BH Assessment (Signed)
[facility] Assessment Progress Note  Per Jacqueline Norman, DO, this pt does not require psychiatric hospitalization at this time.  Pt is to be discharged from [facility] with recommendation to continue treatment with [facility] in [place].  This has been included in pt's discharge instructions.  Pt's nurse has been notified.  Glenn Labus, MA Triage Specialist [phone]     

## 2018-01-04 NOTE — Progress Notes (Signed)
CSW aware of consult- will follow up ASAP.

## 2018-01-04 NOTE — Discharge Instructions (Signed)
For your behavioral health needs, you are advised to continue treatment with [facility]: ° °     [facility] °     [address] °     [place],  [address]  °     [phone] °

## 2018-01-04 NOTE — BH Assessment (Signed)
Assessment Note  Glenn French is an 27 y.o. male.  -Clinician talked to Glenn Heck, PA. Denies illicit drug use. Denies suicidal ideation, homicidal ideation, hallucinations. Per triage note, patient brought in for right temporal headache. When EMS arrived to the scene patient's apartment was in disrepair and uninhabitable. Reportedly someone called EMS bc patient was sending bizarre text messages.   Clinician did talk to patient.  He said that his worker, Glenn French had halled EMS for him.  Patient says Glenn French is his "Research scientist (physical sciences)."  She works with him through [employer].  Patient could not remember what her schedule was however.  Patient said that he used to go to a day program called "[facility]" but he was too advanced for it.  He had wanted to go to the day program called "the [facility]  Pt says that they told him that he was "too independent" for that program.  Patient says he stays at home all day.  He uses public transportation to get where he needs to go.  Patient says he has an appt with his primary care physician, Dr. Synetta French on 01/29 at 14:40.    Patient says he sees Dr. Theodoro French with [facility] for outpatient psychiatric care.  He says he is prescribed medication but he is inconsistent with it because he is not always able to get to appointments or to get medications consistently.    Patient has no SI, HI or A/V hallucinations.  He admits to a attempt to harm self with he was 27 years of age.  He denies any use of drugs or ETOH.  Glenn Heck, PA wanted a tele assessment and for psychiatry to see him regarding whether he has competency.  Patient is his own guardian.  He says his mother does check on him and helps him out.  Patient has a CSW order also.  Diagnosis: F70 I/DD mild  Past Medical History:  Past Medical History:  Diagnosis Date  . Arthritis   . Bipolar 2 disorder (HCC)   . High cholesterol   . Mental retardation     Past Surgical History:  Procedure  Laterality Date  . NOSE SURGERY      Family History: No family history on file.  Social History:  reports that he has been smoking cigarettes.  He has been smoking about 0.50 packs per day. He does not have any smokeless tobacco history on file. He reports that he does not drink alcohol or use drugs.  Additional Social History:  Alcohol / Drug Use Pain Medications: See PTA medication list. Prescriptions: Pt says he is prescribed meds but cannot get them regularly but does not take because of that. Over the Counter: None History of alcohol / drug use?: No history of alcohol / drug abuse  CIWA: CIWA-Ar BP: 117/66 Pulse Rate: 70 COWS:    Allergies:  Allergies  Allergen Reactions  . Bee Venom Anaphylaxis    Home Medications:  (Not in a hospital admission)  OB/GYN Status:  No LMP for male patient.  General Assessment Data Location of Assessment: [facility] TTS Assessment: In system Is this a Tele or Face-to-Face Assessment?: Face-to-Face Is this an Initial Assessment or a Re-assessment for this encounter?: Initial Assessment Marital status: Single Is patient pregnant?: No Pregnancy Status: No Living Arrangements: Alone(Per EMS, home is in disarray.) Can pt return to current living arrangement?: Yes Admission Status: Voluntary Is patient capable of signing voluntary admission?: Yes Referral Source: Other(Pt's worker called EMS for patient.)  Insurance type: MCD     Crisis Care Plan Living Arrangements: Alone(Per EMS, home is in disarray.) Name of Psychiatrist: [facility] Manuela [facility] Name of Therapist: None  Education Status Is patient currently in school?: No Highest grade of school patient has completed: Unknown  Risk to self with the past 6 months Suicidal Ideation: No Has patient been a risk to self within the past 6 months prior to admission? : No Suicidal Intent: No Has patient had any suicidal intent within the past 6 months prior to admission? : No Is patient at  risk for suicide?: No Suicidal Plan?: No Has patient had any suicidal plan within the past 6 months prior to admission? : No Access to Means: No What has been your use of drugs/alcohol within the last 12 months?: None Previous Attempts/Gestures: Yes How many times?: 1 Other Self Harm Risks: None Triggers for Past Attempts: Unknown Intentional Self Injurious Behavior: None Family Suicide History: Unknown Recent stressful life event(s): Recent negative physical changes(Migraines) Persecutory voices/beliefs?: No Depression: No Depression Symptoms: (Pt denies depressive symptoms) Substance abuse history and/or treatment for substance abuse?: No Suicide prevention information given to non-admitted patients: Not applicable  Risk to Others within the past 6 months Homicidal Ideation: No Does patient have any lifetime risk of violence toward others beyond the six months prior to admission? : No Thoughts of Harm to Others: No Current Homicidal Intent: No Current Homicidal Plan: No Access to Homicidal Means: No Identified Victim: No one History of harm to others?: No Assessment of Violence: None Noted Violent Behavior Description: None noted Does patient have access to weapons?: No Criminal Charges Pending?: No Does patient have a court date: No Is patient on probation?: No  Psychosis Hallucinations: None noted Delusions: None noted  Mental Status Report Appearance/Hygiene: Disheveled, Body odor, Poor hygiene, In scrubs Eye Contact: Good Motor Activity: Freedom of movement, Unremarkable Speech: Soft Level of Consciousness: Quiet/awake Mood: Anxious Affect: Angry, Depressed Anxiety Level: Moderate Thought Processes: Coherent, Relevant Judgement: Unimpaired Orientation: Appropriate for developmental age Obsessive Compulsive Thoughts/Behaviors: None  Cognitive Functioning Concentration: Decreased Memory: Recent Impaired, Remote Intact IQ: Below Average Level of Function:  Mild Insight: Fair Impulse Control: Good Appetite: Good Weight Loss: 0 Weight Gain: 0 Sleep: No Change Total Hours of Sleep: 8 Vegetative Symptoms: None  ADLScreening [facility] Assessment Services) Patient's cognitive ability adequate to safely complete daily activities?: Yes Patient able to express need for assistance with ADLs?: Yes Independently performs ADLs?: Yes (appropriate for developmental age)  Prior Inpatient Therapy Prior Inpatient Therapy: No Prior Therapy Dates: None Prior Therapy Facilty/Provider(s): None Reason for Treatment: None  Prior Outpatient Therapy Prior Outpatient Therapy: Yes Prior Therapy Dates: Current Prior Therapy Facilty/Provider(s): [facility] Reason for Treatment: med management Does patient have an ACCT team?: No Does patient have Intensive In-House Services?  : No Does patient have [facility] services? : No Does patient have P4CC services?: No  ADL Screening (condition at time of admission) Patient's cognitive ability adequate to safely complete daily activities?: Yes Is the patient deaf or have difficulty hearing?: No Does the patient have difficulty seeing, even when wearing glasses/contacts?: No Does the patient have difficulty concentrating, remembering, or making decisions?: Yes Patient able to express need for assistance with ADLs?: Yes Does the patient have difficulty dressing or bathing?: No Independently performs ADLs?: Yes (appropriate for developmental age) Does the patient have difficulty walking or climbing stairs?: No Weakness of Legs: None Weakness of Arms/Hands: None       Abuse/Neglect  Assessment (Assessment to be complete while patient is alone) Abuse/Neglect Assessment Can Be Completed: Yes Physical Abuse: Denies Verbal Abuse: Denies Sexual Abuse: Denies Exploitation of patient/patient's resources: Denies Self-Neglect: Denies     Merchant navy officerAdvance Directives (For Healthcare) Does Patient Have a Medical Advance  Directive?: No Would patient like information on creating a medical advance directive?: No - Patient declined    Additional Information 1:1 In Past 12 Months?: No CIRT Risk: No Elopement Risk: No Does patient have medical clearance?: Yes     Disposition:  Disposition Initial Assessment Completed for this Encounter: Yes Disposition of Patient: Other dispositions Other disposition(s): Other (Comment)(To be seen by CSW in AM and psychiatry)  On Site Evaluation by:   Reviewed with Physician:    Beatriz StallionHarvey, Norissa Bartee Ray 01/04/2018 3:30 AM

## 2018-01-04 NOTE — Clinical Social Work Note (Signed)
Clinical Social Work Assessment  Patient Details  Name: Glenn French MRN: [id number] Date of Birth: 1991-02-02  Date of referral:  01/04/18               Reason for consult:  Discharge Planning                Permission sought to share information with:    Permission granted to share information::     Name::        Agency::     Relationship::     Contact Information:     Housing/Transportation Living arrangements for the past 2 months:  Apartment Source of Information:  Patient Patient Interpreter Needed:  None Criminal Activity/Legal Involvement Pertinent to Current Situation/Hospitalization:    Significant Relationships:  Parents(Mother- Glenn French ) Lives with:  Self Do you feel safe going back to the place where you live?  Yes Need for family participation in patient care:  Yes (Comment)  Care giving concerns: Per triage note, patient brought in for right temporal headache. When EMS arrived to the scene patient's apartment was in disrepair and uninhabitable. CSW consulted for discharge planning. TTS also consulted.   Social Worker assessment / plan:  CSW met with patient via bedside to discuss any concerns/ discharge planning. Patient currently lives alone at an apartment ([address]) and states his mom, Glenn French, is his primary support/ friend. Patient states he uses public transportation/ bus to get groceries and wash clothes. CSW informed patient that EMS had concerns for patients apartment- patient voiced no other concerns at this time. Patient states he gets groceries/ washes clothes once a week. Patient does have a payee- Glenn French with [employer] at [phone]. Patient is his own legal guardian.   Patient has appointment in [facility] at 2:45pm today 1/29- patient states he uses the [employer] for transportation when appointments are not local. Patient still currently waiting to be seen by TTS. No further needs by CSW.    Employment status:  Disabled (Comment on whether or not currently receiving Disability) Insurance information:    PT Recommendations:  Not assessed at this time Information / Referral to community resources:     Patient/Family's Response to care:  Patient appreciated CSW.   Patient/Family's Understanding of and Emotional Response to Diagnosis, Current Treatment, and Prognosis: Patient currently waiting for TTS.   Emotional Assessment Appearance:  Appears stated age Attitude/Demeanor/Rapport:    Affect (typically observed):  Happy, Pleasant, Appropriate Orientation:  Oriented to Self, Oriented to Place, Oriented to  Time, Oriented to Situation Alcohol / Substance use:    Psych involvement (Current and /or in the community):  Yes (Comment)  Discharge Needs  Concerns to be addressed:  No discharge needs identified Readmission within the last 30 days:  No Current discharge risk:  None Barriers to Discharge:  No Barriers Identified   Weston Anna, LCSW 01/04/2018, 10:50 AM

## 2018-01-04 NOTE — Care Management Note (Signed)
Case Management Note  CM reviewed chart and does not note any CM needs a this time.  Will defer to CSW.  CM will be available is needs are found by CCSW.

## 2018-01-04 NOTE — [facility] Suicide Risk Assessment (Signed)
Suicide Risk Assessment  Discharge Assessment   [facility]   Principal Problem: Bipolar affective disorder, depressed, in remission [facility](HCC) Discharge Diagnoses:  Patient Active Problem List   Diagnosis Date Noted  . Bipolar affective disorder, depressed, in remission (HCC) [F31.70] 01/04/2018    Total Time spent with patient: 45 minutes  Musculoskeletal: Strength & Muscle Tone: within normal limits Gait & Station: normal Patient leans: N/A  Psychiatric Specialty Exam:   Blood pressure 112/66, pulse 72, temperature 98.6 F (37 C), temperature source Oral, resp. rate 16, SpO2 99 %.There is no height or weight on file to calculate BMI.  General Appearance: Casual  Eye Contact::  Good  Speech:  Clear and Coherent409  Volume:  Normal  Mood:  Depressed  Affect:  Congruent  Thought Process:  Coherent, Goal Directed and Linear  Orientation:  Full (Time, Place, and Person)  Thought Content:  Logical  Suicidal Thoughts:  No  Homicidal Thoughts:  No  Memory:  Immediate;   Good Recent;   Good Remote;   Fair  Judgement:  Fair  Insight:  Fair  Psychomotor Activity:  Normal  Concentration:  Good  Recall:  Good  Fund of Knowledge:Good  Language: Good  Akathisia:  No  Handed:  Right  AIMS (if indicated):     Assets:  ArchitectCommunication Skills Financial Resources/Insurance Housing Social Support Transportation  Sleep:     Cognition: WNL  ADL's:  Intact   Mental Status Per Nursing Assessment::   On Admission:     Demographic Factors:  Adolescent or young adult and Caucasian  Loss Factors: NA  Historical Factors: Impulsivity  Risk Reduction Factors:   Sense of responsibility to family and Living with another person, especially a relative  Continued Clinical Symptoms:  Bipolar Disorder:   Depressive phase  Cognitive Features That Contribute To Risk:  Closed-mindedness    Suicide Risk:  Minimal: No identifiable suicidal ideation.  Patients  presenting with no risk factors but with morbid ruminations; may be classified as minimal risk based on the severity of the depressive symptoms  Follow-up Information    Malka SoJobe, Daniel B., MD Follow up.   Specialty:  Internal Medicine          Plan Of Care/Follow-up recommendations:  Activity:  as tolerated Diet:  Heart healthy  Laveda AbbeLaurie Britton Granvil Djordjevic, NP 01/04/2018, 11:46 AM

## 2018-01-22 ENCOUNTER — Other Ambulatory Visit: Payer: Self-pay

## 2018-01-22 ENCOUNTER — Encounter (HOSPITAL_COMMUNITY): Payer: Self-pay | Admitting: Emergency Medicine

## 2018-01-22 DIAGNOSIS — R1084 Generalized abdominal pain: Secondary | ICD-10-CM | POA: Diagnosis present

## 2018-01-22 DIAGNOSIS — K5900 Constipation, unspecified: Secondary | ICD-10-CM | POA: Diagnosis not present

## 2018-01-22 DIAGNOSIS — F17210 Nicotine dependence, cigarettes, uncomplicated: Secondary | ICD-10-CM | POA: Diagnosis not present

## 2018-01-22 DIAGNOSIS — Z79899 Other long term (current) drug therapy: Secondary | ICD-10-CM | POA: Insufficient documentation

## 2018-01-22 LAB — COMPREHENSIVE METABOLIC PANEL
ALK PHOS: 115 U/L (ref 38–126)
ALT: 66 U/L — AB (ref 17–63)
AST: 38 U/L (ref 15–41)
Albumin: 4.1 g/dL (ref 3.5–5.0)
Anion gap: 11 (ref 5–15)
BUN: 11 mg/dL (ref 6–20)
CALCIUM: 9.3 mg/dL (ref 8.9–10.3)
CHLORIDE: 102 mmol/L (ref 101–111)
CO2: 25 mmol/L (ref 22–32)
CREATININE: 0.67 mg/dL (ref 0.61–1.24)
GFR calc Af Amer: >60 mL/min (ref >60)
Glucose, Bld: 107 mg/dL — ABNORMAL HIGH (ref 65–99)
Potassium: 4.2 mmol/L (ref 3.5–5.1)
SODIUM: 138 mmol/L (ref 135–145)
Total Bilirubin: 0.4 mg/dL (ref 0.3–1.2)
Total Protein: 7 g/dL (ref 6.5–8.1)

## 2018-01-22 LAB — URINALYSIS, ROUTINE W REFLEX MICROSCOPIC
BILIRUBIN URINE: NEGATIVE
GLUCOSE, UA: NEGATIVE mg/dL
HGB URINE DIPSTICK: NEGATIVE
KETONES UR: NEGATIVE mg/dL
Leukocytes, UA: NEGATIVE
Nitrite: NEGATIVE
PROTEIN: NEGATIVE mg/dL
Specific Gravity, Urine: 1.019 (ref 1.005–1.030)
pH: 6 (ref 5.0–8.0)

## 2018-01-22 LAB — CBC
HCT: 47.7 % (ref 39.0–52.0)
Hemoglobin: 15.8 g/dL (ref 13.0–17.0)
MCH: 28.9 pg (ref 26.0–34.0)
MCHC: 33.1 g/dL (ref 30.0–36.0)
MCV: 87.2 fL (ref 78.0–100.0)
PLATELETS: 130 10*3/uL — AB (ref 150–400)
RBC: 5.47 MIL/uL (ref 4.22–5.81)
RDW: 13.3 % (ref 11.5–15.5)
WBC: 10 10*3/uL (ref 4.0–10.5)

## 2018-01-22 LAB — LIPASE, BLOOD: LIPASE: 30 U/L (ref 11–51)

## 2018-01-22 NOTE — ED Triage Notes (Signed)
Pt c/o mid abd pain onset 2 days ago, +nausea, denies diarrhea.  Pt worse with movement.

## 2018-01-22 NOTE — ED Notes (Signed)
Pt unable to void at this time.  Pt given a specimen cup.

## 2018-01-23 ENCOUNTER — Emergency Department (HOSPITAL_COMMUNITY): Payer: Medicaid Other

## 2018-01-23 ENCOUNTER — Emergency Department (HOSPITAL_COMMUNITY)
Admission: EM | Admit: 2018-01-23 | Discharge: 2018-01-23 | Disposition: A | Discharge status: Home / Self Care | Payer: Medicaid Other | Attending: Emergency Medicine | Admitting: Emergency Medicine

## 2018-01-23 DIAGNOSIS — K5900 Constipation, unspecified: Secondary | ICD-10-CM

## 2018-01-23 MED ORDER — POLYETHYLENE GLYCOL 3350 17 GM/SCOOP PO POWD: 17.0000 g | Freq: Every day | ORAL | 0 refills | Status: AC

## 2018-01-23 NOTE — ED Provider Notes (Signed)
[facility] EMERGENCY DEPARTMENT Provider Note   CSN: [id number] Arrival date & time: 01/22/18  1936     History   Chief Complaint Chief Complaint  Patient presents with  . Abdominal Pain    HPI Teyton Pattillo is a 27 y.o. male.  The history is provided by the patient and medical records.  Abdominal Pain     27 year old male with history of arthritis, bipolar 2 disorder, hyperlipidemia, mental retardation, presenting to the ED for abdominal pain.  Reports he has been having stomach pain for about 2 days now.  Reports he feels it mostly around his navel.  States his abdomen feels "full", almost like he is pregnant.  States he has been having a lot of trouble with bowel movements, has not had a full, normal bowel movement in about 1 month.  He denies any nausea or vomiting.  States he does have some pain when eating.  States his abdomen does not feel "tender" but he has pain when moving around or coughing.  No prior abdominal surgeries.  He has been taking some Metamucil at home to help with his constipation, but has not had any significant relief.  Past Medical History:  Diagnosis Date  . Arthritis   . Bipolar 2 disorder (HCC)   . High cholesterol   . Mental retardation     Patient Active Problem List   Diagnosis Date Noted  . Bipolar affective disorder, depressed, in remission (HCC) 01/04/2018    Past Surgical History:  Procedure Laterality Date  . NOSE SURGERY         Home Medications    Prior to Admission medications   Medication Sig Start Date End Date Taking? Authorizing Provider  bacitracin ointment Apply 1 application topically 2 (two) times daily. Patient not taking: Reported on 01/04/2018 09/12/17   Roxy Horseman, PA-C  divalproex (DEPAKOTE ER) 250 MG 24 hr tablet Take 500 mg by mouth at bedtime.    [provider]  gabapentin (NEURONTIN) 300 MG capsule Take 300 mg by mouth 2 (two) times daily.    [provider]    ibuprofen (ADVIL,MOTRIN) 200 MG tablet Take 600 mg by mouth daily as needed for headache or moderate pain.    [provider]  montelukast (SINGULAIR) 10 MG tablet Take 10 mg by mouth at bedtime.    [provider]  Paliperidone Palmitate (INVEGA SUSTENNA) 117 MG/0.75ML SUSP Inject 117 mg into the muscle every 30 (thirty) days.    [provider]    Family History No family history on file.  Social History Social History   Tobacco Use  . Smoking status: Current Every Day Smoker    Packs/day: 0.50    Types: Cigarettes  . Smokeless tobacco: Never Used  Substance Use Topics  . Alcohol use: No  . Drug use: No     Allergies   Bee venom   Review of Systems Review of Systems  Gastrointestinal: Positive for abdominal pain.  All other systems reviewed and are negative.    Physical Exam Updated Vital Signs BP 126/76   Pulse (!) 103   Temp 97.7 F (36.5 C) (Oral)   Resp 20   Ht 5\' 6"  (1.676 m)   Wt 97.1 kg (214 lb)   SpO2 99%   BMI 34.54 kg/m   Physical Exam  Constitutional: He is oriented to person, place, and time. He appears well-developed and well-nourished.  HENT:  Head: Normocephalic and atraumatic.  Mouth/Throat: Oropharynx is clear  and moist.  Eyes: Conjunctivae and EOM are normal. Pupils are equal, round, and reactive to light.  Neck: Normal range of motion.  Cardiovascular: Normal rate, regular rhythm and normal heart sounds.  Pulmonary/Chest: Effort normal and breath sounds normal.  Abdominal: Soft. Bowel sounds are normal. There is no tenderness. There is no rigidity and no guarding.  Musculoskeletal: Normal range of motion.  Neurological: He is alert and oriented to person, place, and time.  Skin: Skin is warm and dry.  Psychiatric: He has a normal mood and affect.  Nursing note and vitals reviewed.    ED Treatments / Results  Labs (all labs ordered are listed, but only abnormal results are displayed) Labs Reviewed   COMPREHENSIVE METABOLIC PANEL - Abnormal; Notable for the following components:      Result Value   Glucose, Bld 107 (*)    ALT 66 (*)    All other components within normal limits  CBC - Abnormal; Notable for the following components:   Platelets 130 (*)    All other components within normal limits  LIPASE, BLOOD  URINALYSIS, ROUTINE W REFLEX MICROSCOPIC    EKG  EKG Interpretation None       Radiology Dg Abd Acute W/chest  Result Date: 01/23/2018 CLINICAL DATA:  Abdominal pain and constipation. EXAM: DG ABDOMEN ACUTE W/ 1V CHEST COMPARISON:  None. FINDINGS: The cardiomediastinal contours are normal. The lungs are clear. There is no free intra-abdominal air. No dilated bowel loops to suggest obstruction. Moderate to large volume of stool in the ascending, transverse, and descending colon. Tortuous gaseous distension of sigmoid colon. No abnormal rectal distention. No radiopaque calculi. No acute osseous abnormalities are seen. IMPRESSION: Moderate to large colonic stool burden with sigmoid tortuosity. No bowel obstruction or fecal impaction. Clear lungs. Electronically Signed   By: Rubye OaksMelanie  Ehinger M.D.   On: 01/23/2018 02:51    Procedures Procedures (including critical care time)  Medications Ordered in ED Medications - No data to display   Initial Impression / Assessment and Plan / ED Course  I have reviewed the triage vital signs and the nursing notes.  Pertinent labs & imaging results that were available during my care of the patient were reviewed by me and considered in my medical decision making (see chart for details).  27 year old male here with abdominal pain.  States he feels "pregnant".  Does admit he has been having some trouble with his bowel movements lately.  He has been taking Metamucil without relief.  He is afebrile and nontoxic.  Abdomen is soft and benign.  States he feels "full" around his navel.  Screening labs obtained from triage are overall reassuring.   Will obtain acute abdominal series.  Acute abdominal series with moderate to large colonic stool burden, no bowel obstruction or fecal impaction.  Will start on MiraLAX to help ease bowel movements.  Discussed diet to help with constipation as well. Close follow-up with PCP.  Discussed plan with patient, he acknowledged understanding and agreed with plan of care.  Return precautions given for new or worsening symptoms.  Final Clinical Impressions(s) / ED Diagnoses   Final diagnoses:  Constipation, unspecified constipation type    ED Discharge Orders        Ordered    polyethylene glycol powder (GLYCOLAX/MIRALAX) powder  Daily     01/23/18 0346       Garlon HatchetSanders, Rajanae Mantia M, PA-C 01/23/18 [id number]    Rolland PorterJames, Mark, MD 01/27/18 2332

## 2018-01-23 NOTE — Discharge Instructions (Signed)
Start taking the miralax.  Take everyday for now until your stomach feels better, then can take as needed. Follow-up with your primary care doctor. Return here for any new/acute changes.

## 2018-02-24 ENCOUNTER — Ambulatory Visit: Payer: Medicaid Other | Admitting: Neurology

## 2018-03-05 ENCOUNTER — Emergency Department (HOSPITAL_COMMUNITY): Payer: Medicaid Other

## 2018-03-05 ENCOUNTER — Encounter (HOSPITAL_COMMUNITY): Payer: Self-pay | Admitting: Nurse Practitioner

## 2018-03-05 DIAGNOSIS — M62838 Other muscle spasm: Secondary | ICD-10-CM | POA: Diagnosis not present

## 2018-03-05 DIAGNOSIS — F17210 Nicotine dependence, cigarettes, uncomplicated: Secondary | ICD-10-CM | POA: Insufficient documentation

## 2018-03-05 DIAGNOSIS — F79 Unspecified intellectual disabilities: Secondary | ICD-10-CM | POA: Insufficient documentation

## 2018-03-05 DIAGNOSIS — Z79899 Other long term (current) drug therapy: Secondary | ICD-10-CM | POA: Diagnosis not present

## 2018-03-05 DIAGNOSIS — R0602 Shortness of breath: Secondary | ICD-10-CM | POA: Diagnosis not present

## 2018-03-05 LAB — CBC
HEMATOCRIT: 46 % (ref 39.0–52.0)
HEMOGLOBIN: 16.2 g/dL (ref 13.0–17.0)
MCH: 29.9 pg (ref 26.0–34.0)
MCHC: 35.2 g/dL (ref 30.0–36.0)
MCV: 84.9 fL (ref 78.0–100.0)
Platelets: 161 10*3/uL (ref 150–400)
RBC: 5.42 MIL/uL (ref 4.22–5.81)
RDW: 13.2 % (ref 11.5–15.5)
WBC: 11.9 10*3/uL — AB (ref 4.0–10.5)

## 2018-03-05 LAB — I-STAT TROPONIN, ED: TROPONIN I, POC: 0.02 ng/mL (ref 0.00–0.08)

## 2018-03-05 LAB — BASIC METABOLIC PANEL
ANION GAP: 12 (ref 5–15)
BUN: 9 mg/dL (ref 6–20)
CALCIUM: 9.5 mg/dL (ref 8.9–10.3)
CO2: 23 mmol/L (ref 22–32)
Chloride: 103 mmol/L (ref 101–111)
Creatinine, Ser: 0.73 mg/dL (ref 0.61–1.24)
Glucose, Bld: 145 mg/dL — ABNORMAL HIGH (ref 65–99)
POTASSIUM: 3.4 mmol/L — AB (ref 3.5–5.1)
SODIUM: 138 mmol/L (ref 135–145)

## 2018-03-05 NOTE — ED Triage Notes (Signed)
Pt is c/o lip spasms, chest pain and weakness.

## 2018-03-05 NOTE — ED Notes (Signed)
I attempted twice to collect labs and was unsuccessful 

## 2018-03-06 ENCOUNTER — Emergency Department (HOSPITAL_COMMUNITY)
Admission: EM | Admit: 2018-03-06 | Discharge: 2018-03-06 | Disposition: A | Discharge status: Home / Self Care | Payer: Medicaid Other | Attending: Emergency Medicine | Admitting: Emergency Medicine

## 2018-03-06 DIAGNOSIS — M62838 Other muscle spasm: Secondary | ICD-10-CM

## 2018-03-06 NOTE — ED Notes (Signed)
[facility]

## 2018-03-06 NOTE — ED Provider Notes (Signed)
[facility]-EMERGENCY DEPT Provider Note: Lowella Dell, MD, FACEP  CSN: [id number] MRN: [id number] ARRIVAL: 03/05/18 at 2057 ROOM: WA14/[facility]14   CHIEF COMPLAINT  Spasms   HISTORY OF PRESENT ILLNESS  03/06/18 1:32 AM Glenn French is a 27 y.o. male with a history of mental retardation.  He complains of spasms in his lips, arms and chest that began about 8 PM yesterday evening.  Symptoms were mild to moderate.  There was nothing that made them better or worse.  He also felt short of breath at the time.  These resolved about midnight and have not recurred.  He does not have a history of similar symptoms.  He denies nausea or vomiting.  He did feel general weakness.   Past Medical History:  Diagnosis Date  . Arthritis   . Bipolar 2 disorder (HCC)   . High cholesterol   . Mental retardation     Past Surgical History:  Procedure Laterality Date  . NOSE SURGERY      History reviewed. No pertinent family history.  Social History   Tobacco Use  . Smoking status: Current Every Day Smoker    Packs/day: 0.50    Types: Cigarettes  . Smokeless tobacco: Never Used  Substance Use Topics  . Alcohol use: No  . Drug use: No    Prior to Admission medications   Medication Sig Start Date End Date Taking? Authorizing Provider  gabapentin (NEURONTIN) 300 MG capsule Take 300 mg by mouth 2 (two) times daily.   Yes [provider]  montelukast (SINGULAIR) 10 MG tablet Take 10 mg by mouth at bedtime.   Yes [provider]  Paliperidone Palmitate (INVEGA SUSTENNA) 117 MG/0.75ML SUSP Inject 117 mg into the muscle every 30 (thirty) days.   Yes [provider]  polyethylene glycol powder (GLYCOLAX/MIRALAX) powder Take 17 g by mouth daily. Until daily soft stools  OTC 01/23/18  Yes Garlon Hatchet, PA-C  bacitracin ointment Apply 1 application topically 2 (two) times daily. Patient not taking: Reported on 01/04/2018 09/12/17   Roxy Horseman, PA-C    Allergies Bee  venom   REVIEW OF SYSTEMS  Negative except as noted here or in the History of Present Illness.   PHYSICAL EXAMINATION  Initial Vital Signs Blood pressure 126/89, pulse 87, temperature 98 F (36.7 C), temperature source Oral, resp. rate (!) 23, SpO2 98 %.  Examination General: Well-developed, well-nourished male in no acute distress; appearance consistent with age of record HENT: normocephalic; atraumatic Eyes: pupils equal, round and reactive to light; extraocular muscles intact Neck: supple Heart: regular rate and rhythm Lungs: clear to auscultation bilaterally Abdomen: soft; nondistended; nontender; bowel sounds present Extremities: No acute deformity; pulses normal; chronic appearing contractures of the great toes Neurologic: Awake, alert; motor function intact in all extremities and symmetric; no facial droop Skin: Warm and dry Psychiatric: Normal mood and affect   RESULTS  Summary of this visit's results, reviewed by myself:   EKG Interpretation  Date/Time:  Saturday March 05 2018 21:13:49 EDT Ventricular Rate:  99 PR Interval:    QRS Duration: 86 QT Interval:  341 QTC Calculation: 438 R Axis:   84 Text Interpretation:  Sinus rhythm Borderline T abnormalities, anterior leads Baseline wander in lead(s) V5 V6 Confirmed by Blaze Nylund ([id number]) on 03/06/2018 1:32:31 AM      Laboratory Studies: Results for orders placed or performed during the hospital encounter of 03/06/18 (from the past 24 hour(s))  Basic metabolic panel     Status: Abnormal  Collection Time: 03/05/18  9:53 PM  Result Value Ref Range   Sodium 138 135 - 145 mmol/L   Potassium 3.4 (L) 3.5 - 5.1 mmol/L   Chloride 103 101 - 111 mmol/L   CO2 23 22 - 32 mmol/L   Glucose, Bld 145 (H) 65 - 99 mg/dL   BUN 9 6 - 20 mg/dL   Creatinine, Ser 1.610.73 0.61 - 1.24 mg/dL   Calcium 9.5 8.9 - 09.610.3 mg/dL   GFR calc non Af Amer >60 >60 mL/min   GFR calc Af Amer >60 >60 mL/min   Anion gap 12 5 - 15  CBC     Status:  Abnormal   Collection Time: 03/05/18  9:53 PM  Result Value Ref Range   WBC 11.9 (H) 4.0 - 10.5 K/uL   RBC 5.42 4.22 - 5.81 MIL/uL   Hemoglobin 16.2 13.0 - 17.0 g/dL   HCT 04.546.0 40.939.0 - 81.152.0 %   MCV 84.9 78.0 - 100.0 fL   MCH 29.9 26.0 - 34.0 pg   MCHC 35.2 30.0 - 36.0 g/dL   RDW 91.413.2 78.211.5 - 95.615.5 %   Platelets 161 150 - 400 K/uL  I-stat troponin, ED     Status: None   Collection Time: 03/05/18  9:59 PM  Result Value Ref Range   Troponin i, poc 0.02 0.00 - 0.08 ng/mL   Comment 3           Imaging Studies: Dg Chest 2 View  Result Date: 03/05/2018 CLINICAL DATA:  Lip spasms, chest pain and weakness. EXAM: CHEST - 2 VIEW COMPARISON:  01/23/2018 FINDINGS: Cardiomediastinal silhouette is normal. Mediastinal contours appear intact. There is no evidence of focal airspace consolidation, pleural effusion or pneumothorax. Osseous structures are without acute abnormality. Soft tissues are grossly normal. IMPRESSION: No active cardiopulmonary disease. Electronically Signed   By: Ted Mcalpineobrinka  Dimitrova M.D.   On: 03/05/2018 22:41    ED COURSE  Nursing notes and initial vitals signs, including pulse oximetry, reviewed.  Vitals:   03/05/18 2114 03/06/18 0100  BP: 124/86 126/89  Pulse: (!) 105 87  Resp: 18 (!) 23  Temp: 98 F (36.7 C)   TempSrc: Oral   SpO2: 97% 98%   1:49 AM The patient is asymptomatic at this time and has been for about 2 hours.  His lab work is unremarkable.  His symptoms may represent a side effect of his psychiatric medications.  He appears to be stable for discharge at this time.  PROCEDURES    ED DIAGNOSES     ICD-10-CM   1. Muscle spasm [id number]        Paula LibraMolpus, Zilpha Mcandrew, MD 03/06/18 [id number]

## 2018-04-21 ENCOUNTER — Encounter (HOSPITAL_COMMUNITY): Payer: Self-pay | Admitting: Emergency Medicine

## 2018-04-21 ENCOUNTER — Emergency Department (HOSPITAL_COMMUNITY)
Admission: EM | Admit: 2018-04-21 | Discharge: 2018-04-21 | Disposition: A | Discharge status: Home / Self Care | Payer: Medicaid Other | Attending: Emergency Medicine | Admitting: Emergency Medicine

## 2018-04-21 ENCOUNTER — Emergency Department (HOSPITAL_COMMUNITY): Payer: Medicaid Other

## 2018-04-21 ENCOUNTER — Other Ambulatory Visit: Payer: Self-pay

## 2018-04-21 DIAGNOSIS — F79 Unspecified intellectual disabilities: Secondary | ICD-10-CM | POA: Insufficient documentation

## 2018-04-21 DIAGNOSIS — R42 Dizziness and giddiness: Secondary | ICD-10-CM | POA: Diagnosis not present

## 2018-04-21 DIAGNOSIS — R0789 Other chest pain: Secondary | ICD-10-CM | POA: Insufficient documentation

## 2018-04-21 DIAGNOSIS — F17210 Nicotine dependence, cigarettes, uncomplicated: Secondary | ICD-10-CM | POA: Diagnosis not present

## 2018-04-21 DIAGNOSIS — R079 Chest pain, unspecified: Secondary | ICD-10-CM | POA: Diagnosis present

## 2018-04-21 DIAGNOSIS — Z79899 Other long term (current) drug therapy: Secondary | ICD-10-CM | POA: Diagnosis not present

## 2018-04-21 LAB — CBC
HCT: 46.5 % (ref 39.0–52.0)
Hemoglobin: 15.8 g/dL (ref 13.0–17.0)
MCH: 28 pg (ref 26.0–34.0)
MCHC: 34 g/dL (ref 30.0–36.0)
MCV: 82.4 fL (ref 78.0–100.0)
PLATELETS: 113 10*3/uL — AB (ref 150–400)
RBC: 5.64 MIL/uL (ref 4.22–5.81)
RDW: 12.7 % (ref 11.5–15.5)
WBC: 6.4 10*3/uL (ref 4.0–10.5)

## 2018-04-21 LAB — I-STAT TROPONIN, ED: TROPONIN I, POC: 0 ng/mL (ref 0.00–0.08)

## 2018-04-21 LAB — BASIC METABOLIC PANEL
Anion gap: 13 (ref 5–15)
BUN: 5 mg/dL — ABNORMAL LOW (ref 6–20)
CALCIUM: 9.2 mg/dL (ref 8.9–10.3)
CHLORIDE: 102 mmol/L (ref 101–111)
CO2: 21 mmol/L — AB (ref 22–32)
CREATININE: 0.74 mg/dL (ref 0.61–1.24)
GFR calc Af Amer: >60 mL/min (ref >60)
GFR calc non Af Amer: >60 mL/min (ref >60)
GLUCOSE: 102 mg/dL — AB (ref 65–99)
Potassium: 3.5 mmol/L (ref 3.5–5.1)
Sodium: 136 mmol/L (ref 135–145)

## 2018-04-21 NOTE — Discharge Instructions (Signed)
May take meloxicam as needed if your chest pain returns.  He may apply a heating pad or ice pack for comfort.  Drink plenty of water and get plenty of rest.  Follow-up with your primary care physician for reevaluation of your dizziness and your chest pain.  Return to the emergency department if any concerning signs or symptoms develop such as fever, worsening chest pain or shortness of breath.

## 2018-04-21 NOTE — ED Provider Notes (Signed)
[facility] EMERGENCY DEPARTMENT Provider Note   CSN: [id number] Arrival date & time: 04/21/18  1526     History   Chief Complaint Chief Complaint  Patient presents with  . Chest Pain    HPI Glenn French is a 27 y.o. male with history of bipolar 2 disorder, mental retardation, and high cholesterol presents for evaluation of acute onset, resolved episode of pain to the right side of the chest.  He states that pain began at around 3 PM when he was checking his mail.  Pain was sharp and primarily originated in the pectoralis region and radiated into the right shoulder.  He states that he had numbness and tingling of the right upper extremity.  The symptoms have been steadily improving and have entirely resolved upon my examination.  Pain worsened with movement of the right shoulder.  He denies associated shortness of breath, lightheadedness, nausea, vomiting.  He did note an episode of dizziness that lasted approximately 10 minutes.  He states that he has been having these episodes of dizziness intermittently for several months.  Episodes are sudden onset and associated with nausea but no vomiting.  The episodes are brought on by turning his head rapidly and improved with rest.  He denies headaches, vision changes, or syncope.  He tells me that he has had a nonproductive cough for the past week but denies fevers, chills, sore throat, nasal congestion.  He has not tried anything for his symptoms.  He is a smoker of approximately a pack of cigarettes daily.  He denies excessive alcohol intake or recreational drug use.  No significant family history of cardiac disease to his knowledge.   The history is provided by the patient.    Past Medical History:  Diagnosis Date  . Arthritis   . Bipolar 2 disorder (HCC)   . High cholesterol   . Mental retardation     Patient Active Problem List   Diagnosis Date Noted  . Bipolar affective disorder, depressed, in remission (HCC)  01/04/2018    Past Surgical History:  Procedure Laterality Date  . NOSE SURGERY          Home Medications    Prior to Admission medications   Medication Sig Start Date End Date Taking? Authorizing Provider  gabapentin (NEURONTIN) 300 MG capsule Take 300 mg by mouth 2 (two) times daily.   Yes [provider]  montelukast (SINGULAIR) 10 MG tablet Take 10 mg by mouth at bedtime.   Yes [provider]  Paliperidone Palmitate (INVEGA SUSTENNA) 117 MG/0.75ML SUSP Inject 117 mg into the muscle every 30 (thirty) days.   Yes [provider]  polyethylene glycol powder (GLYCOLAX/MIRALAX) powder Take 17 g by mouth daily. Until daily soft stools  OTC 01/23/18  Yes Garlon Hatchet, PA-C    Family History No family history on file.  Social History Social History   Tobacco Use  . Smoking status: Current Every Day Smoker    Packs/day: 0.50    Types: Cigarettes  . Smokeless tobacco: Never Used  Substance Use Topics  . Alcohol use: No  . Drug use: No     Allergies   Bee venom   Review of Systems Review of Systems  Constitutional: Negative for chills and fever.  Respiratory: Positive for cough. Negative for shortness of breath.   Cardiovascular: Positive for chest pain (resolved).  Gastrointestinal: Negative for abdominal pain, diarrhea, nausea and vomiting.  Musculoskeletal: Positive for arthralgias.  Neurological: Positive for dizziness (resolved)  and numbness (resolved). Negative for seizures, weakness, light-headedness and headaches.  All other systems reviewed and are negative.    Physical Exam Updated Vital Signs BP 139/72 (BP Location: Left Arm)   Pulse 84   Temp 98.9 F (37.2 C) (Oral)   Resp 18   SpO2 98%   Physical Exam  Constitutional: He is oriented to person, place, and time. He appears well-developed and well-nourished. No distress.  HENT:  Head: Normocephalic and atraumatic.  Eyes: Pupils are equal, round, and reactive to  light. Conjunctivae and EOM are normal. Right eye exhibits no discharge. Left eye exhibits no discharge.  Neck: Normal range of motion. Neck supple. No JVD present. No tracheal deviation present.  Cardiovascular: Normal rate and regular rhythm.  Pulmonary/Chest: Effort normal and breath sounds normal. No accessory muscle usage or stridor. No tachypnea. No respiratory distress.  There is a focal area of tenderness to the chest wall as indicated below.  There is no deformity, crepitus, ecchymosis, or flail segment noted.  Equal rise and fall of chest, no increased work of breathing.  Patient speaking in full sentences without difficulty.    Abdominal: Soft. Bowel sounds are normal. He exhibits no distension and no mass. There is no tenderness. There is no guarding.  Musculoskeletal: He exhibits no edema.       Right shoulder: Normal. He exhibits normal range of motion, no tenderness, no bony tenderness, no swelling, no effusion, no crepitus, no deformity, no laceration, no pain, no spasm, normal pulse and normal strength.       Right lower leg: Normal. He exhibits no tenderness and no edema.       Left lower leg: Normal. He exhibits no tenderness and no edema.  5/5 strength of BUE and BLE major muscle groups.  Negative empty can sign, negative Neer's or Hawkins impingement test of the right shoulder.  Neurological: He is alert and oriented to person, place, and time. He is not disoriented. No cranial nerve deficit.  Mental Status:  Alert, thought content appropriate, able to give a coherent history. Speech fluent without evidence of aphasia. Able to follow 2 step commands without difficulty.  Cranial Nerves:  II:  Peripheral visual fields grossly normal, pupils equal, round, reactive to light III,IV, VI: ptosis not present, extra-ocular motions intact bilaterally  V,VII: smile symmetric, facial light touch sensation equal VIII: hearing grossly normal to voice  X: uvula elevates symmetrically  XI:  bilateral shoulder shrug symmetric and strong XII: midline tongue extension without fassiculations Motor:  Normal tone. 5/5 strength of BUE and BLE major muscle groups including strong and equal grip strength and dorsiflexion/plantar flexion Sensory: light touch normal in all extremities. Cerebellar: normal finger-to-nose with bilateral upper extremities Gait: normal gait and balance. Able to walk on toes and heels with ease.  No pronator drift, no nystagmus.   Skin: Skin is warm and dry. No erythema.  Psychiatric: He has a normal mood and affect. His behavior is normal.  Nursing note and vitals reviewed.    ED Treatments / Results  Labs (all labs ordered are listed, but only abnormal results are displayed) Labs Reviewed  BASIC METABOLIC PANEL - Abnormal; Notable for the following components:      Result Value   CO2 21 (*)    Glucose, Bld 102 (*)    BUN 5 (*)    All other components within normal limits  CBC - Abnormal; Notable for the following components:   Platelets 113 (*)    All  other components within normal limits  I-STAT TROPONIN, ED    EKG None  Radiology Dg Chest 2 View  Result Date: 04/21/2018 CLINICAL DATA:  Right-sided numbness and clavicular pain, initial encounter EXAM: CHEST - 2 VIEW COMPARISON:  03/05/2018 FINDINGS: The heart size and mediastinal contours are within normal limits. Both lungs are clear. The visualized skeletal structures are unremarkable. IMPRESSION: No active cardiopulmonary disease. Electronically Signed   By: Alcide Clever M.D.   On: 04/21/2018 17:18    Procedures Procedures (including critical care time)  Medications Ordered in ED Medications - No data to display   Initial Impression / Assessment and Plan / ED Course  I have reviewed the triage vital signs and the nursing notes.  Pertinent labs & imaging results that were available during my care of the patient were reviewed by me and considered in my medical decision making (see  chart for details).     Patient presents for evaluation of acute onset, improved episode of pain to the right side of the chest as well as an episode of dizziness which was also self-limited.  He was initially mildly tachycardic on evaluation with resolution while in the ED. He tells me that he was quite anxious on arrival initially.  Chest pain is reproducible on palpation small area focally along the right anterior chest wall.  Pain also elicited with movement of the right shoulder.  Chest x-ray shows no acute cardiopulmonary abnormalities.  Troponin is negative and his symptoms do not sound cardiac in etiology.  He is extremely low risk for ACS or MI.  I doubt PE in the absence of shortness of breath and with symptoms that appear more musculoskeletal in etiology.  Remainder of lab work reviewed by me shows mildly low bicarb which could be secondary to anxiety and hyperventilation, no anemia, no electrolyte abnormalities, no leukocytosis.  EKG shows sinus tachycardia but no evidence of ST segment abnormality or ischemic changes.  No evidence of pericarditis, myocarditis, bronchitis, or pneumonia. He has a completely normal neuro exam at this time with no focal neurologic deficits.  The episodes of dizziness that he describes appear consistent with BPPV is rare episodic with no vomiting and with symptoms elicited with sudden movement of the head.  I have a low suspicion of CVA, ICH, SAH, acoustic neuroma, or other acute intracranial abnormality.  He is ambulatory without difficulty in the ED.  Discussed conservative treatment with anti-inflammatories for management of his chest wall pain.  Recommend follow-up with his primary care physician for reevaluation of his vertiginous symptoms and possible initiation of meclizine.  Discussed strict ED return precautions. Pt verbalized understanding of and agreement with plan and is safe for discharge home at this time.   Final Clinical Impressions(s) / ED Diagnoses     Final diagnoses:  Atypical chest pain  Chest wall pain  Vertigo    ED Discharge Orders    None      Bennye Alm 04/22/18 0032  Tilden Fossa, MD 04/22/18 0127

## 2018-04-21 NOTE — ED Triage Notes (Signed)
Pt c/o chest pain that radiates to the right arm, with arm numbness that started 30 mins PTA. Also c/o cough x 1 week.

## 2018-04-26 ENCOUNTER — Emergency Department (HOSPITAL_COMMUNITY)
Admission: EM | Admit: 2018-04-26 | Discharge: 2018-04-27 | Disposition: A | Discharge status: Home / Self Care | Payer: Medicaid Other | Attending: Emergency Medicine | Admitting: Emergency Medicine

## 2018-04-26 ENCOUNTER — Encounter (HOSPITAL_COMMUNITY): Payer: Self-pay | Admitting: Emergency Medicine

## 2018-04-26 DIAGNOSIS — R45851 Suicidal ideations: Secondary | ICD-10-CM | POA: Insufficient documentation

## 2018-04-26 DIAGNOSIS — F332 Major depressive disorder, recurrent severe without psychotic features: Secondary | ICD-10-CM | POA: Diagnosis present

## 2018-04-26 DIAGNOSIS — F70 Mild intellectual disabilities: Secondary | ICD-10-CM | POA: Diagnosis not present

## 2018-04-26 DIAGNOSIS — F17210 Nicotine dependence, cigarettes, uncomplicated: Secondary | ICD-10-CM | POA: Diagnosis not present

## 2018-04-26 LAB — RAPID URINE DRUG SCREEN, HOSP PERFORMED
AMPHETAMINES: NOT DETECTED
Barbiturates: NOT DETECTED
Benzodiazepines: NOT DETECTED
Cocaine: NOT DETECTED
Opiates: NOT DETECTED
TETRAHYDROCANNABINOL: NOT DETECTED

## 2018-04-26 LAB — COMPREHENSIVE METABOLIC PANEL
ALT: 42 U/L (ref 17–63)
ANION GAP: 12 (ref 5–15)
AST: 29 U/L (ref 15–41)
Albumin: 4.3 g/dL (ref 3.5–5.0)
Alkaline Phosphatase: 101 U/L (ref 38–126)
BUN: <5 mg/dL — ABNORMAL LOW (ref 6–20)
CO2: 24 mmol/L (ref 22–32)
Calcium: 9.3 mg/dL (ref 8.9–10.3)
Chloride: 103 mmol/L (ref 101–111)
Creatinine, Ser: 0.76 mg/dL (ref 0.61–1.24)
GFR calc non Af Amer: >60 mL/min (ref >60)
Glucose, Bld: 103 mg/dL — ABNORMAL HIGH (ref 65–99)
Potassium: 3.4 mmol/L — ABNORMAL LOW (ref 3.5–5.1)
SODIUM: 139 mmol/L (ref 135–145)
Total Bilirubin: 0.7 mg/dL (ref 0.3–1.2)
Total Protein: 7.3 g/dL (ref 6.5–8.1)

## 2018-04-26 LAB — CBC
HCT: 46.6 % (ref 39.0–52.0)
HEMOGLOBIN: 15.8 g/dL (ref 13.0–17.0)
MCH: 28.1 pg (ref 26.0–34.0)
MCHC: 33.9 g/dL (ref 30.0–36.0)
MCV: 82.9 fL (ref 78.0–100.0)
PLATELETS: 175 10*3/uL (ref 150–400)
RBC: 5.62 MIL/uL (ref 4.22–5.81)
RDW: 12.7 % (ref 11.5–15.5)
WBC: 10 10*3/uL (ref 4.0–10.5)

## 2018-04-26 LAB — ETHANOL: Alcohol, Ethyl (B): <10 mg/dL (ref <10)

## 2018-04-26 LAB — ACETAMINOPHEN LEVEL

## 2018-04-26 LAB — SALICYLATE LEVEL

## 2018-04-26 NOTE — ED Triage Notes (Signed)
Pt arrives with police stating that he wants to stop breathing and he has felt this way all his life. No specific plan, no HI. Denies hopelessness or new aggravating factors. HX bipolar, MR. States he hasn't taken his regular meds since March.

## 2018-04-27 ENCOUNTER — Encounter (HOSPITAL_COMMUNITY): Payer: Self-pay | Admitting: Registered Nurse

## 2018-04-27 NOTE — ED Notes (Signed)
ED Provider at bedside. 

## 2018-04-27 NOTE — Progress Notes (Signed)
CSw spoke with pt at bedside. CSW provided pt with contact information for [facility] DSS to assist with changing pt's payee. Pt expressed that he has tried this but is always told that they cant change pt's payee. CSW spoke with pt about group home placement as pt expressed that to PA last night. Pt expressed that pt has been in contact with [facility] and has someone who helps with housing. CSW expressed that pt needed to request a Care Coordinator if pt is interested in Group Home placement.Pt verbalized being agreeable to trying again with DSS about payee. At this time there are no further CSW needs CSW will sign off.    Montel Clock S. Sanye Ledesma, MSW, LCSW-A Emergency Department Clinical Social Worker [phone]

## 2018-04-27 NOTE — Discharge Planning (Signed)
[facility] consulted regarding PCS/group home information for pt. Will coordinate with EDSW.

## 2018-04-27 NOTE — ED Provider Notes (Signed)
  Physical Exam  BP 128/80 (BP Location: Right Arm)   Pulse 69   Temp 99.1 F (37.3 C) (Oral)   Resp 16   Ht  (1.676 m)   Wt 99.8 kg (220 lb)   SpO2 95%   BMI 35.51 kg/m   Physical Exam  ED Course/Procedures     Procedures  MDM   Patient seen by psych and social work. Recommend discharge with resources.      Charlynne Pander, MD 04/27/18 [phone]

## 2018-04-27 NOTE — ED Provider Notes (Signed)
[facility] EMERGENCY DEPARTMENT Provider Note   CSN: [id number] Arrival date & time: 04/26/18  2116     History   Chief Complaint Chief Complaint  Patient presents with  . Medical Clearance    HPI Glenn French is a 27 y.o. male.  The history is provided by the patient and medical records. No language interpreter was used.   Glenn French is a 27 y.o. male  with a PMH of MR, Bipolar disorder who presents to the Emergency Department with police.  Patient states that he "is just waiting to die".  He states that he lives alone and does not feel that he can always take care of himself.  He is frustrated because he feels as if he would be better suited in a group home, but does not want to lose any of his independence.  He does not feel like he would act on any of these emotions.  Denies homicidal ideation.  Denies auditory or visual hallucinations.  No recent illness.  He has not taken any of his regular medications in the last 2 months.  Past Medical History:  Diagnosis Date  . Arthritis   . Bipolar 2 disorder (HCC)   . High cholesterol   . Mental retardation     Patient Active Problem List   Diagnosis Date Noted  . Bipolar affective disorder, depressed, in remission (HCC) 01/04/2018    Past Surgical History:  Procedure Laterality Date  . NOSE SURGERY          Home Medications    Prior to Admission medications   Medication Sig Start Date End Date Taking? Authorizing Provider  polyethylene glycol powder (GLYCOLAX/MIRALAX) powder Take 17 g by mouth daily. Until daily soft stools  OTC Patient not taking: Reported on 04/26/2018 01/23/18   Garlon Hatchet, PA-C    Family History No family history on file.  Social History Social History   Tobacco Use  . Smoking status: Current Every Day Smoker    Packs/day: 0.50    Types: Cigarettes  . Smokeless tobacco: Never Used  Substance Use Topics  . Alcohol use: No  . Drug use: No     Allergies   Bee  venom   Review of Systems Review of Systems  Psychiatric/Behavioral: Negative for hallucinations.  All other systems reviewed and are negative.    Physical Exam Updated Vital Signs BP (!) 123/93 (BP Location: Right Arm)   Pulse 100   Temp 98.1 F (36.7 C) (Oral)   Resp 18   Ht  (1.676 m)   Wt 99.8 kg (220 lb)   SpO2 100%   BMI 35.51 kg/m   Physical Exam  Constitutional: He is oriented to person, place, and time. He appears well-developed and well-nourished. No distress.  HENT:  Head: Normocephalic and atraumatic.  Cardiovascular: Normal rate, regular rhythm and normal heart sounds.  No murmur heard. Pulmonary/Chest: Effort normal and breath sounds normal. No respiratory distress. He has no wheezes. He has no rales.  Abdominal: Soft. He exhibits no distension. There is no tenderness.  Musculoskeletal: Normal range of motion.  Neurological: He is alert and oriented to person, place, and time.  Skin: Skin is warm and dry.  Nursing note and vitals reviewed.    ED Treatments / Results  Labs (all labs ordered are listed, but only abnormal results are displayed) Labs Reviewed  COMPREHENSIVE METABOLIC PANEL - Abnormal; Notable for the following components:      Result Value  Potassium 3.4 (*)    Glucose, Bld 103 (*)    BUN <5 (*)    All other components within normal limits  ACETAMINOPHEN LEVEL - Abnormal; Notable for the following components:   Acetaminophen (Tylenol), Serum <10 (*)    All other components within normal limits  ETHANOL  SALICYLATE LEVEL  CBC  RAPID URINE DRUG SCREEN, HOSP PERFORMED    EKG None  Radiology No results found.  Procedures Procedures (including critical care time)  Medications Ordered in ED Medications - No data to display   Initial Impression / Assessment and Plan / ED Course  I have reviewed the triage vital signs and the nursing notes.  Pertinent labs & imaging results that were available during my care of the  patient were reviewed by me and considered in my medical decision making (see chart for details).    Glenn French is a 27 y.o. male who presents to ED by police for reporting that he wanted to stop breathing. Patient reported to me that he is just "waiting to die". He mostly feels this way because he lives at home alone and does not feel that he is always able to take care of himself. He believes that living in a group home would be beneficial, but he does not want to lose his independence. I put in consult for case management - hopefully they can see him in the am and help with possible home health aide vs group home resources. Labs reassuring. Medically cleared with dispo per TTS recommendations.   Final Clinical Impressions(s) / ED Diagnoses   Final diagnoses:  Suicidal ideation    ED Discharge Orders    None       Tereza Gilham, Chase Picket, PA-C 04/27/18 0138    Melene Plan, DO 04/27/18 0202

## 2018-04-27 NOTE — Consult Note (Signed)
  Tele Assessment   Glenn French, 27 y.o., male patient presented to [facility] via law enforcement after he made the statement that he wanted to stop breathing and was waiting to die.  Patient seen via telepsych by this provider; chart reviewed and consulted with Dr. Lucianne Muss on 04/27/18.  On evaluation Darryon Bastin reports "I came to the hospital because the police said this is the place to help me."  When asked what he needed help with patient stated "I live alone, I don't handle my own money and I had a verbal fight with my payee.  I don't see my own pay check and every time I ask she either doesn't pay attention or says something smart.  I want to be my own payee."  Patient informed that we did not handle things like that but the social worker may be able to give him advice on where he needed to go or what he needed to do."  Patient states that he is "not suicidal or homicidal; I'm not having hallucinations or paranoid."  Patient states that he was recently inpatient at [facility] and was to follow up with [facility]; started on Abilify but doesn't have the money.  Patient instructed to go back to [facility] and they could help with the medication.    During evaluation Tijuan Dantes is alert/oriented x 4; calm/cooperative; and mood congruent with affect.  He does not appear to be responding to internal/external stimuli or delusional thoughts.  Patient denied suicidal/self-harm/homicidal ideation, psychosis, and paranoia.  Patient answered question appropriately.  Recommendations:  Patient psychiatrically cleared.  Follow up with [facility].  Social worker to give resource/information where need to go or what could do to change payee.    Disposition: No evidence of imminent risk to self or others at present.   Patient does not meet criteria for psychiatric inpatient admission.    Assunta Found, NP

## 2018-04-27 NOTE — ED Notes (Signed)
Regular lunch tray ordered 

## 2018-04-27 NOTE — ED Notes (Signed)
Pt valuables sent with security. #[id number]
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
## 2018-04-27 NOTE — ED Notes (Signed)
Pt signed medical clearance policy  

## 2018-04-27 NOTE — Discharge Instructions (Signed)
See a counselor and primary care doctor  Return to ER if you have plans to kill yourself or others, hallucinations.   Substance Abuse Treatment Programs  Intensive Outpatient Programs [facility]     [address]      [place], [place]                   [phone]       [facility] [address] [place] [address] [place], [place] [phone]  [facility]     (Inpatient and outpatient)     [address]           [phone]    [facility] [phone] (Suboxone and Methadone)  [address]      [place], [place] [address]      [phone]       [address] [place], [place] [phone]  [facility] (Outpatient/Inpatient, Chemical)    (insurance only) [phone]             [facility] (Groups & Residential) [place], [place] [phone]     [facility]     [address]     [place], [place]      [phone]       [facility] (for caregivers and family) [phone] [address] [place], [place] [phone]      Residential Treatment Programs [facility]      [address], [place], [place] [address]  [phone]       [facility] [address] [place], [place] [address] [phone]  [facility] [place]        [phone]       [facility] [phone]  [facility] ([facility]             [address]                                         [place], [place]                                                [phone] or [phone]                               [facility] [place] [address] [place], [address] [phone]  [facility]    [address]      [place], [place]     [phone]       [facility] [address] [place], [place] [phone]  [facility]   [address] [place], [place]  [address]     [phone]      Admissions: 8am-3pm M-F  [facility] Services (RTS) [address] [place], [place] [phone]  [facility]: Residential Program [phone] Days)   [place], [place]      [phone] or [phone]     ADATC: [facility] [place], [place] (Walk in Hours over the weekend or by referral)  [facility] [address] [place], [place], [place] [address] [phone]  Crisis Mobile: [facility]:  [phone] (for crisis  response 24 hours a day) [facility] Hotline:      [phone] Outpatient Psychiatry and Counseling  [facility]: Mobile Crisis Management 24 hours:  [phone]  [facility] of the [employer] sliding scale fee and walk in schedule: M-F 8am-12pm/1pm-3pm [address]  [place], [place] [address] [phone]  [facility] [address] [place], [place] [address] [phone]  [facility] (Former[facility] [employer])- new patient walk-in appointments available Monday - Friday 8am -3pm.          [address] [place], [place] [address] [phone] or crisis line- [phone]  [facility] Out[facility] Services/ Intensive Outpatient Therapy Program [address] [place], [place] [address] [phone]  [facility]                  Crisis Services      [phone] [address]     [place], [place] [address]                 [facility]   [facility] [phone]. [address] [place], [place] [address]   [employer] of Care          [address]  [place], [place] [address]       [phone]  [facility] [address] [place], [place] [address] [phone]  [facility]    [address]    [place], [place] [address]     [phone]       Andee Poles,  MD     [address]     [place] [address]     [phone]       [facility] [address] [place] [address]  [facility]     [address] [place], [place]      [phone]       [facility] Eulogio Ditch, MD [address] [place], [place] [address] [phone]  [facility]     [address]     [place], [place] [address]     [phone]       [facility] [address] [place], [place] [address] [phone] Resources for Temporary Residential Assistance/Crisis Centers  DAY CENTERS [facility]) M-F 8am-3pm   [address] [place], [place] [address]   [phone] Services include: laundry, barbering, support groups, case management, phone  & computer access, showers, AA/NA mtgs, mental health/substance abuse nurse, job skills class, disability information, VA assistance, spiritual classes, etc.   HOMELESS SHELTERS  [facility]     [facility]   [address], [place]     [phone]              [employer] (women and children)       [address] [place], [place] [address] [phone] [email or web address] for application and process Application Required  [facility]   [address]    [place] [address]     [phone]                    [facility] [address] [place], [place] [address] [phone] application appt.) Application Required  [employer] (women only)  [place], [place] [address]     [phone]      Intake starts 6pm daily Need valid ID, SSC, & Police report [employer] [address] [place], [place] [phone] Application Required  [employer] (men only)     [place].      [place],  [place]       [place] (Pregnant women only) [address] [place], [place]  [facility]      [place] [address]      [place], [place] [address]     [phone]             [facility] [address] [place], [place] 90 day commitment/SA/Application process  [facility](men only)     [address]     [place], [place]       Check-in at [facility] [address] [place],  [address] [phone] Men/Women/Women and Children must be there by 7 pm  [place], [place]

## 2018-04-27 NOTE — ED Notes (Signed)
TTS at bedside. 

## 2018-04-27 NOTE — ED Notes (Signed)
Pt's belongings inventoried and stored in locker #4

## 2018-04-27 NOTE — Progress Notes (Signed)
Pt is recommended for d/c.  Physician Extender asked for an ED CSW visit to assist pt with getting information on how to change payee as he claims payee is not giving him money.  CSW called [facility] ED CSW, Montel Clock and asked her to speak to patient about contacting DSS.  CSW had also contacted [facility] to see if pt has a Gaffer.  Pt has a Advertising account planner but not a Gaffer. CSW recommends that pt contact [facility] and make an appoint to obtain care coordination.  Timmothy Euler. Kaylyn Lim, MSW, LCSWA Disposition Clinical Social Work [phone] (cell) [phone] (office)

## 2019-06-13 ENCOUNTER — Ambulatory Visit: Payer: Self-pay

## 2019-06-13 NOTE — Telephone Encounter (Signed)
Incoming call from Glenn French. Complaining of a productive cough and with an onset of 2 weeks ago.  Patient reports feeling warm to the touch, but has no thermometer.  Reports sputum is clear. Patient want a covid-19 test.  Related to Patient that appointment with his provider then the provider would make the order.  Glenn French. voices understanding.          Reason for Disposition . Cough  Answer Assessment - Initial Assessment Questions 1. ONSET: "When did the cough begin?"      Two weeks ago 2. SEVERITY: "How bad is the cough today?"      mild 3. RESPIRATORY DISTRESS: "Describe your breathing."     'fine" 4. FEVER: "Do you have a fever?" If so, ask: "What is your temperature, how was it measured, and when did it start?"     No thermter 5. SPUTUM: "Describe the color of your sputum" (clear, white, yellow, green)    Clear  6. HEMOPTYSIS: "Are you coughing up any blood?" If so ask: "How much?" (flecks, streaks, tablespoons, etc.)      7. CARDIAC HISTORY: "Do you have any history of heart disease?" (e.g., heart attack, congestive heart failure)      denies 8. LUNG HISTORY: "Do you have any history of lung disease?"  (e.g., pulmonary embolus, asthma, emphysema)     denies 9. PE RISK FACTORS: "Do you have a history of blood clots?" (or: recent major surgery, recent prolonged travel, bedridden)     denies 10. OTHER SYMPTOMS: "Do you have any other symptoms?" (e.g., runny nose, wheezing, chest pain)        11. PREGNANCY: "Is there any chance you are pregnant?" "When was your last menstrual period?"       na 12. TRAVEL: "Have you traveled out of the country in the last month?" (e.g., travel history, exposures)       *No Answer*  Protocols used: [place]

## 2019-08-25 ENCOUNTER — Ambulatory Visit: Payer: Medicaid Other | Admitting: Family Medicine

## 2020-01-14 IMAGING — DX DG ABDOMEN ACUTE W/ 1V CHEST
3 series · 3 of 3 positions shown · non-contrast
Comparison: None.

CLINICAL DATA: Abdominal pain and constipation.

EXAM:
DG ABDOMEN ACUTE W/ 1V CHEST

[chest pa]
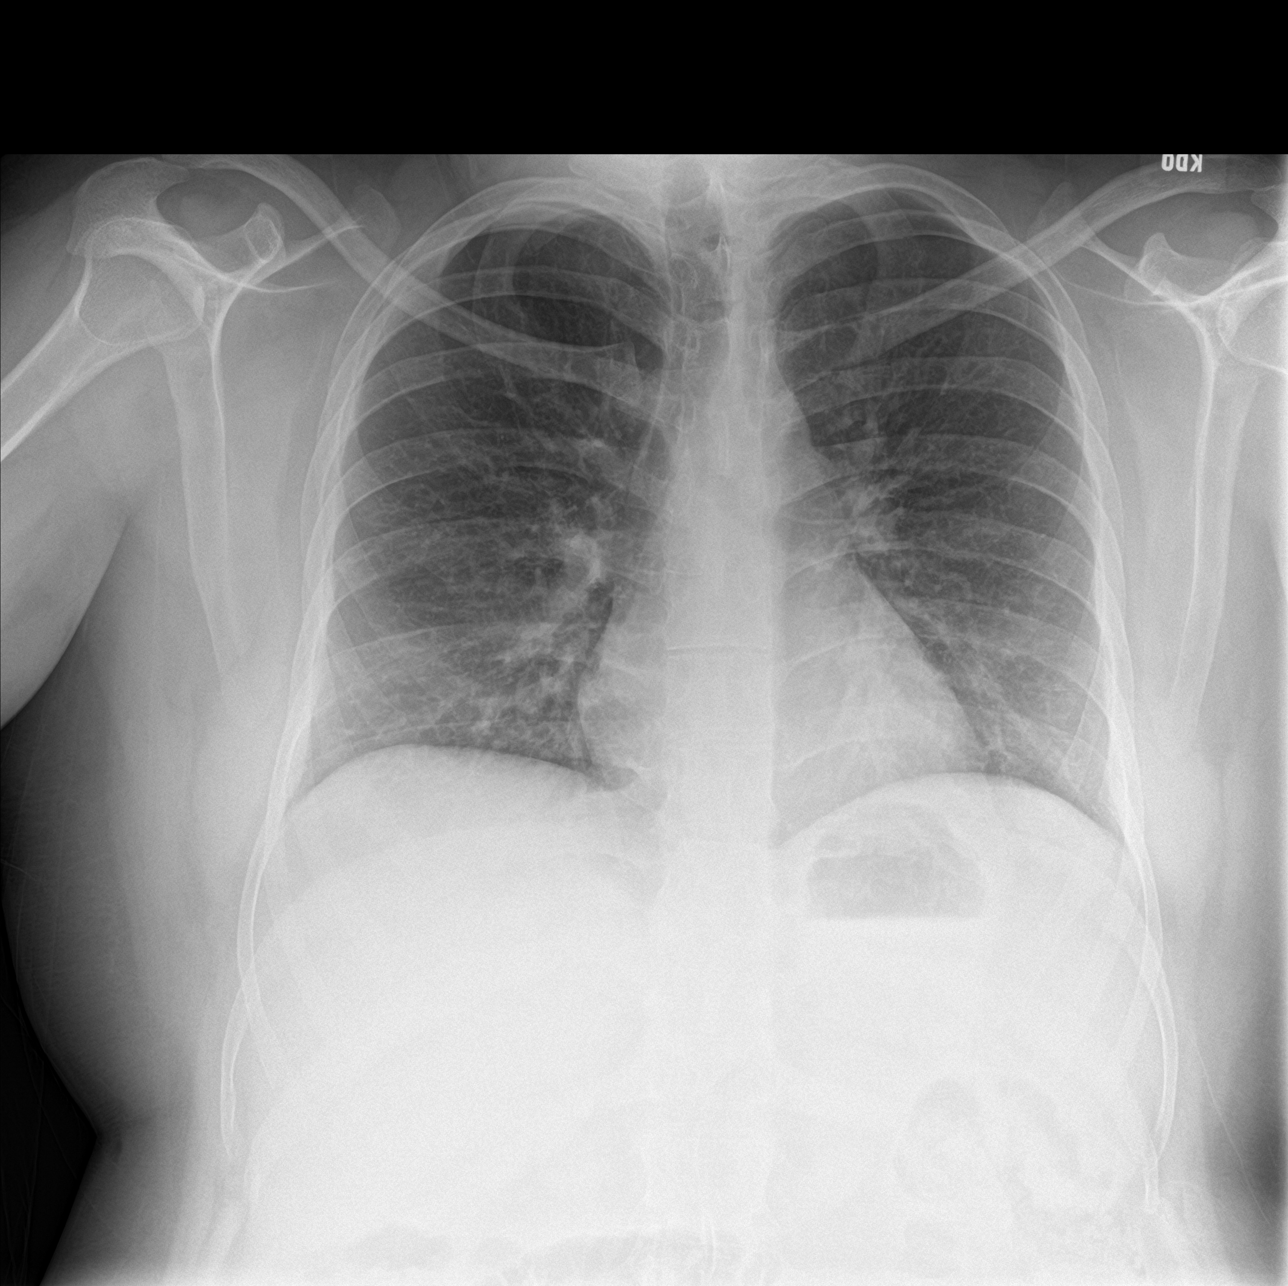

[abdomen erect]
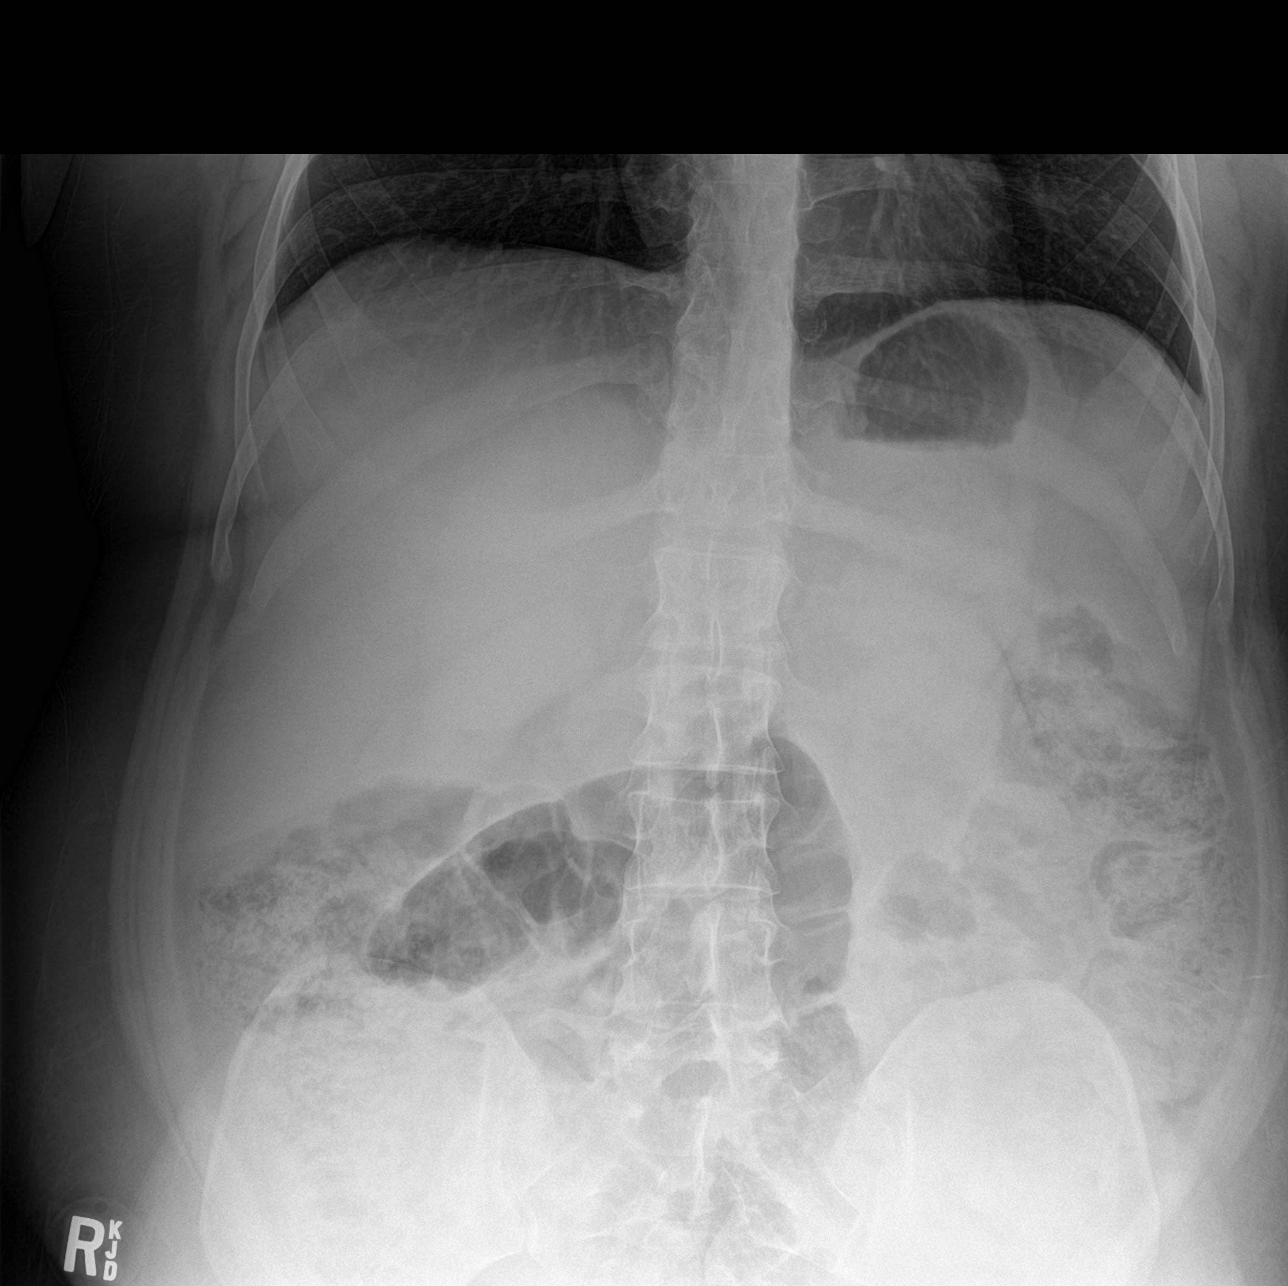

[abdomen supine]
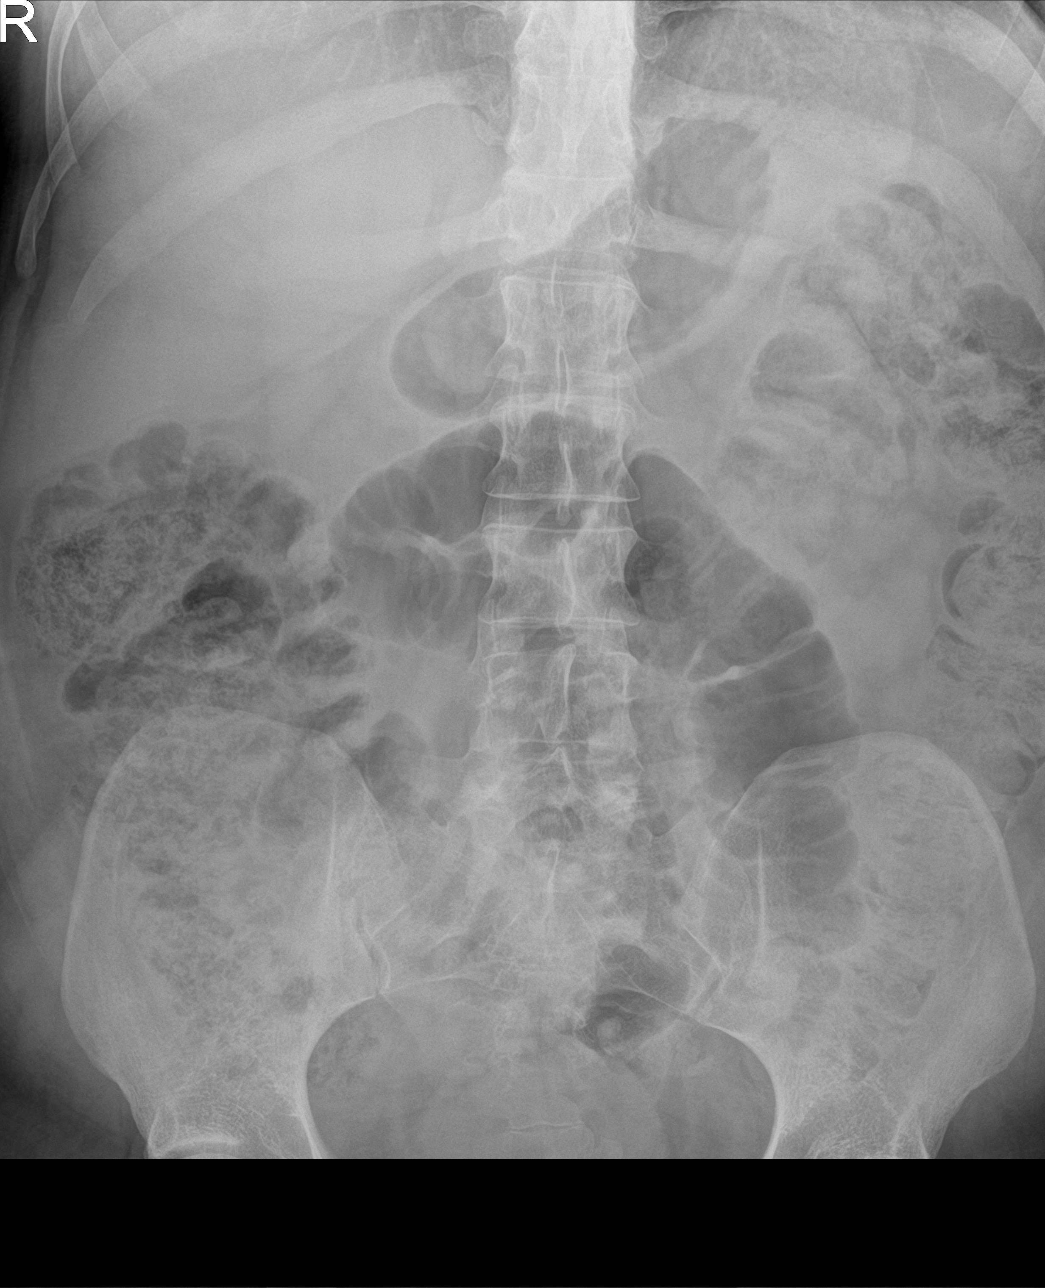

[3 of 3 positions shown; findings below may reference images not displayed]

FINDINGS: The cardiomediastinal contours are normal. The lungs are clear.
There is no free intra-abdominal air. No dilated bowel loops to
suggest obstruction. Moderate to large volume of stool in the
ascending, transverse, and descending colon. Tortuous gaseous
distension of sigmoid colon. No abnormal rectal distention. No
radiopaque calculi. No acute osseous abnormalities are seen.
IMPRESSION: Moderate to large colonic stool burden with sigmoid tortuosity. No
bowel obstruction or fecal impaction.

Clear lungs.

## 2020-04-11 IMAGING — DX DG CHEST 2V
2 series · 2 of 2 positions shown · non-contrast
Comparison: 03/05/2018

CLINICAL DATA: Right-sided numbness and clavicular pain, initial
encounter

EXAM:
CHEST - 2 VIEW

[chest lat]
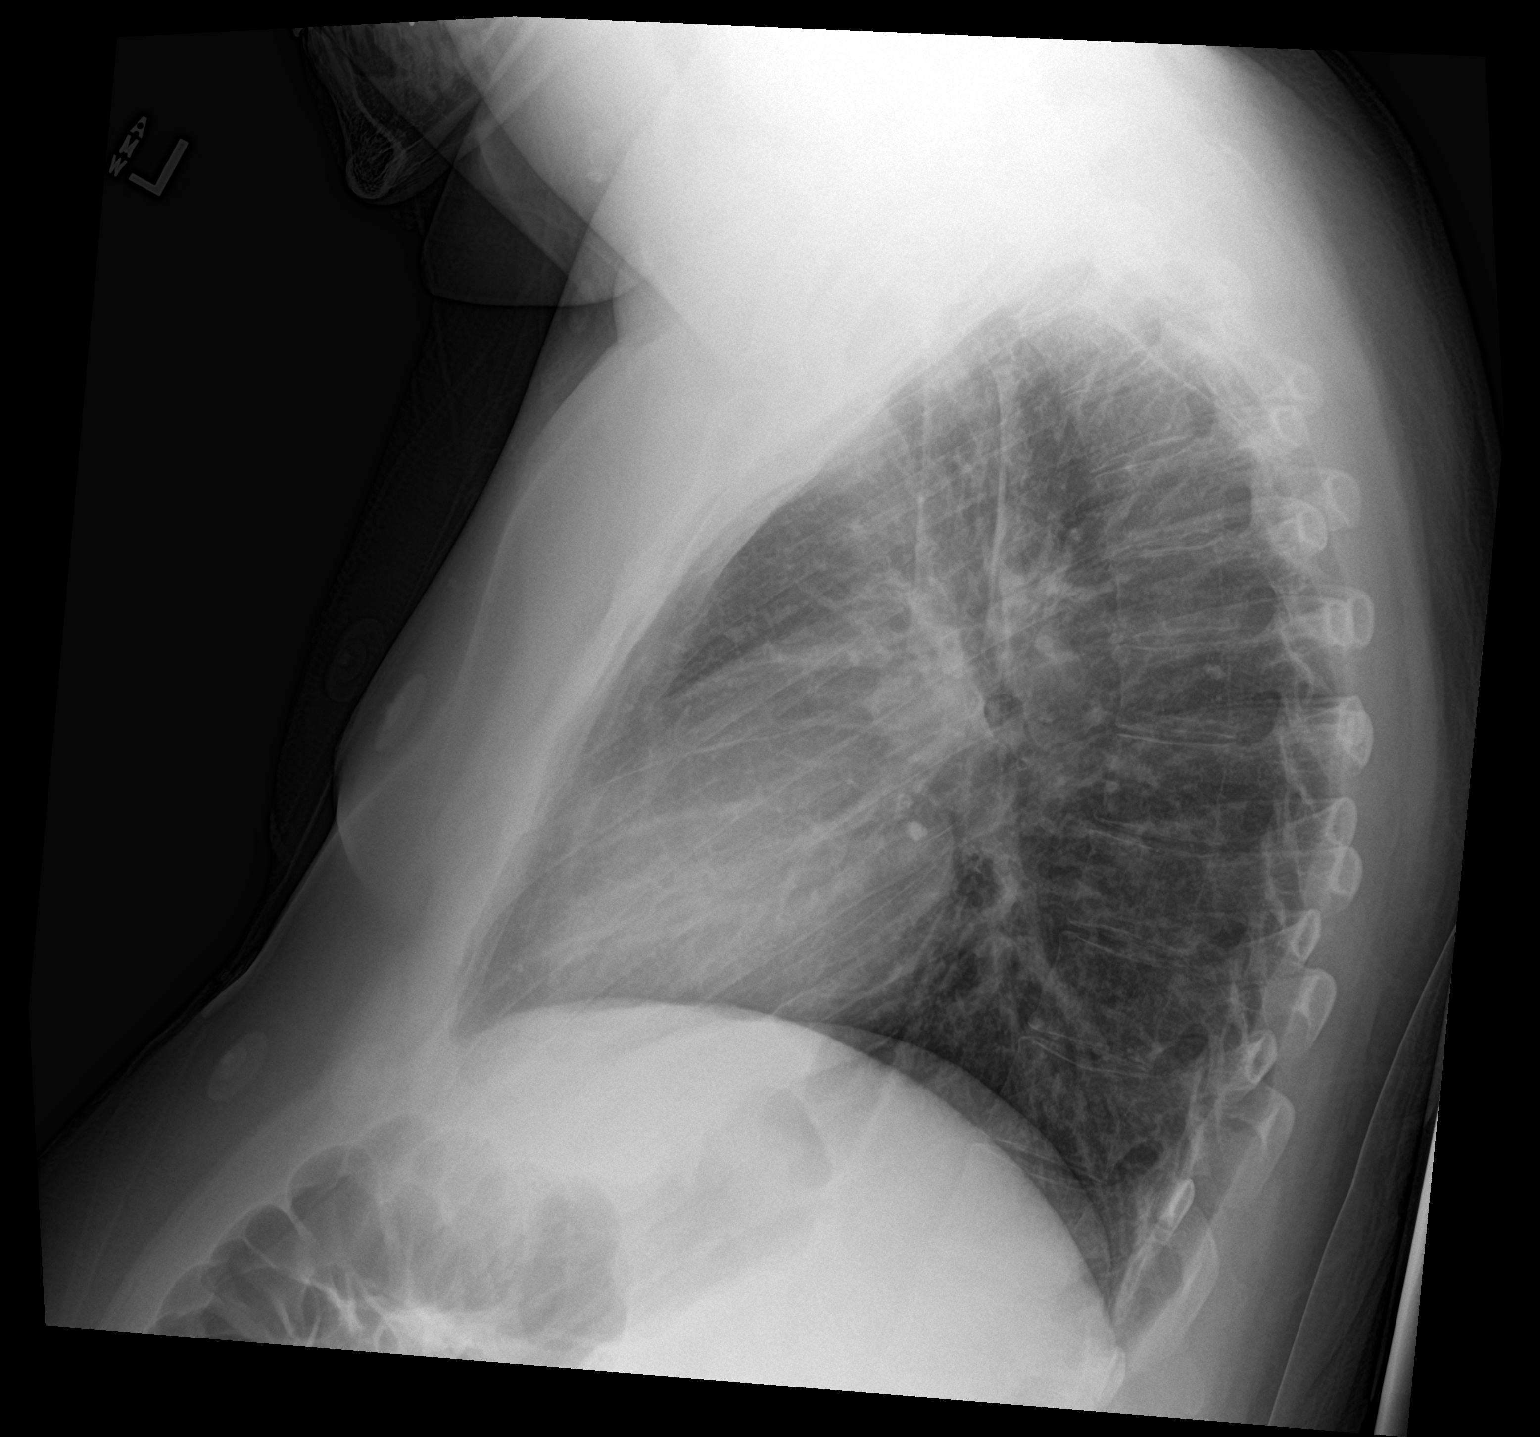

[chest ap]
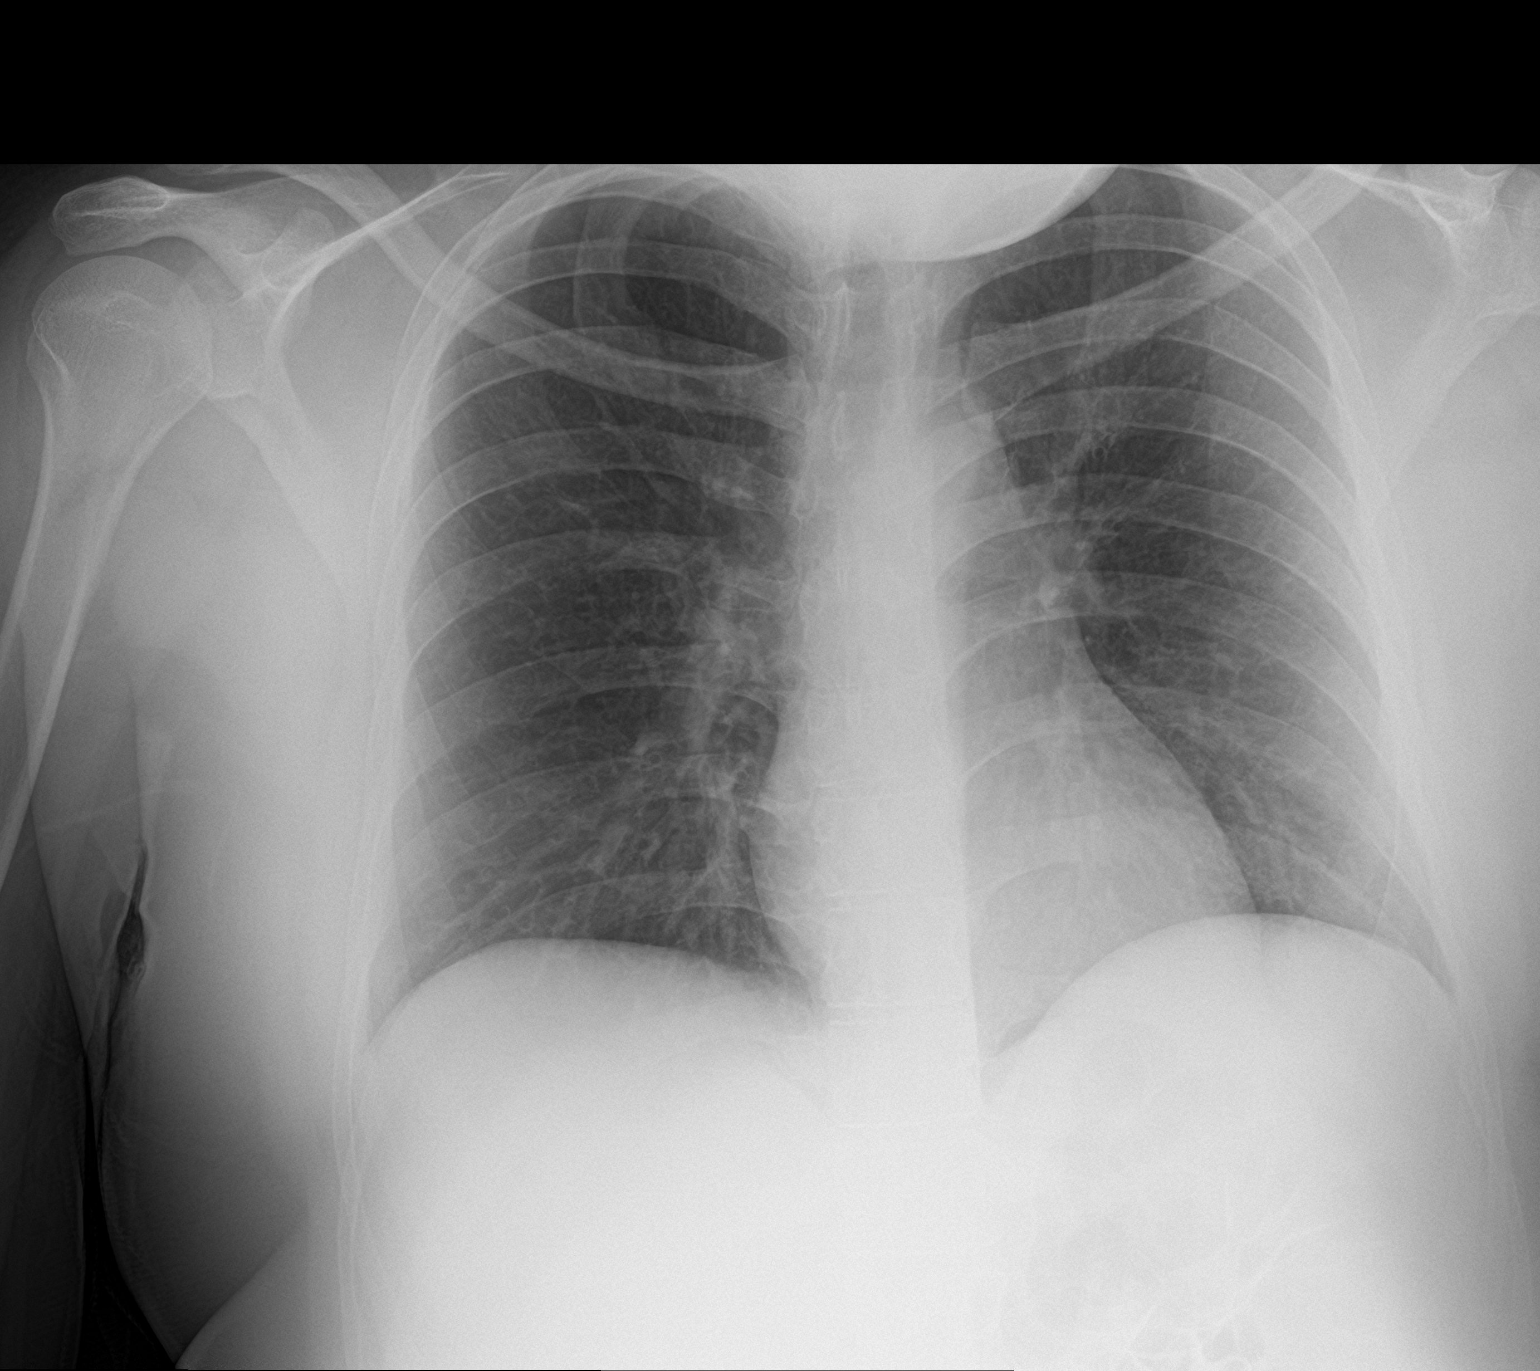

[2 of 2 positions shown; findings below may reference images not displayed]

FINDINGS: The heart size and mediastinal contours are within normal limits.
Both lungs are clear. The visualized skeletal structures are
unremarkable.
IMPRESSION: No active cardiopulmonary disease.
